# Patient Record
Sex: Male | Born: 1975 | Race: White | Hispanic: No | Marital: Married | Smoking: Current every day smoker
Health system: Southern US, Community
[De-identification: ages and names within clinical notes are randomized; demographics above are authoritative.]

## PROBLEM LIST (undated history)

## (undated) DIAGNOSIS — N200 Calculus of kidney: Secondary | ICD-10-CM

## (undated) DIAGNOSIS — K219 Gastro-esophageal reflux disease without esophagitis: Secondary | ICD-10-CM

## (undated) DIAGNOSIS — K3184 Gastroparesis: Secondary | ICD-10-CM

## (undated) DIAGNOSIS — F4310 Post-traumatic stress disorder, unspecified: Secondary | ICD-10-CM

## (undated) DIAGNOSIS — F32A Depression, unspecified: Secondary | ICD-10-CM

## (undated) DIAGNOSIS — F329 Major depressive disorder, single episode, unspecified: Secondary | ICD-10-CM

## (undated) DIAGNOSIS — F419 Anxiety disorder, unspecified: Secondary | ICD-10-CM

## (undated) HISTORY — PX: APPENDECTOMY: SHX54

## (undated) HISTORY — DX: Depression, unspecified: F32.A

## (undated) HISTORY — PX: ANKLE SURGERY: SHX546

## (undated) HISTORY — DX: Gastro-esophageal reflux disease without esophagitis: K21.9

## (undated) HISTORY — PX: KIDNEY STONE SURGERY: SHX686

## (undated) HISTORY — PX: VASECTOMY: SHX75

## (undated) HISTORY — DX: Major depressive disorder, single episode, unspecified: F32.9

## (undated) HISTORY — PX: FRACTURE SURGERY: SHX138

## (undated) HISTORY — DX: Anxiety disorder, unspecified: F41.9

---

## 2012-09-29 ENCOUNTER — Encounter (HOSPITAL_COMMUNITY): Payer: Self-pay | Admitting: Emergency Medicine

## 2012-09-29 ENCOUNTER — Emergency Department (HOSPITAL_COMMUNITY)
Admission: EM | Admit: 2012-09-29 | Discharge: 2012-10-03 | Disposition: A | Discharge status: Home / Self Care | Attending: Emergency Medicine | Admitting: Emergency Medicine

## 2012-09-29 DIAGNOSIS — F17200 Nicotine dependence, unspecified, uncomplicated: Secondary | ICD-10-CM | POA: Insufficient documentation

## 2012-09-29 DIAGNOSIS — R45851 Suicidal ideations: Secondary | ICD-10-CM | POA: Insufficient documentation

## 2012-09-29 DIAGNOSIS — Z79899 Other long term (current) drug therapy: Secondary | ICD-10-CM | POA: Insufficient documentation

## 2012-09-29 DIAGNOSIS — F4310 Post-traumatic stress disorder, unspecified: Secondary | ICD-10-CM

## 2012-09-29 DIAGNOSIS — F1010 Alcohol abuse, uncomplicated: Secondary | ICD-10-CM | POA: Insufficient documentation

## 2012-09-29 DIAGNOSIS — Z87442 Personal history of urinary calculi: Secondary | ICD-10-CM | POA: Insufficient documentation

## 2012-09-29 DIAGNOSIS — Z8719 Personal history of other diseases of the digestive system: Secondary | ICD-10-CM | POA: Insufficient documentation

## 2012-09-29 DIAGNOSIS — R197 Diarrhea, unspecified: Secondary | ICD-10-CM

## 2012-09-29 DIAGNOSIS — F10929 Alcohol use, unspecified with intoxication, unspecified: Secondary | ICD-10-CM

## 2012-09-29 HISTORY — DX: Calculus of kidney: N20.0

## 2012-09-29 HISTORY — DX: Gastroparesis: K31.84

## 2012-09-29 HISTORY — DX: Post-traumatic stress disorder, unspecified: F43.10

## 2012-09-29 LAB — BASIC METABOLIC PANEL
CO2: 23 mEq/L (ref 19–32)
GFR calc non Af Amer: 82 mL/min — ABNORMAL LOW (ref >90)
Glucose, Bld: 122 mg/dL — ABNORMAL HIGH (ref 70–99)
Potassium: 3.7 mEq/L (ref 3.5–5.1)
Sodium: 138 mEq/L (ref 135–145)

## 2012-09-29 LAB — RAPID URINE DRUG SCREEN, HOSP PERFORMED
Barbiturates: NOT DETECTED
Benzodiazepines: NOT DETECTED
Cocaine: NOT DETECTED
Opiates: NOT DETECTED
Tetrahydrocannabinol: NOT DETECTED

## 2012-09-29 LAB — CBC
Hemoglobin: 16.8 g/dL (ref 13.0–17.0)
Platelets: 322 10*3/uL (ref 150–400)
RBC: 5.17 MIL/uL (ref 4.22–5.81)

## 2012-09-29 MED ORDER — HYDROXYZINE HCL 25 MG PO TABS
25.0000 mg | ORAL_TABLET | Freq: Every day | ORAL | Status: DC
  Administered 2012-09-29 – 2012-10-02 (×4): 25 mg via ORAL
  Filled 2012-09-29 (×3): qty 1

## 2012-09-29 MED ORDER — ALUM & MAG HYDROXIDE-SIMETH 200-200-20 MG/5ML PO SUSP: 30.0000 mL | ORAL | Status: DC | PRN

## 2012-09-29 MED ORDER — NICOTINE 21 MG/24HR TD PT24
21.0000 mg | MEDICATED_PATCH | Freq: Every day | TRANSDERMAL | Status: DC
  Administered 2012-09-29 – 2012-09-30 (×2): 21 mg via TRANSDERMAL
  Filled 2012-09-29 (×2): qty 1

## 2012-09-29 MED ORDER — ZOLPIDEM TARTRATE 5 MG PO TABS
5.0000 mg | ORAL_TABLET | Freq: Every evening | ORAL | Status: DC | PRN
  Administered 2012-09-29 – 2012-09-30 (×2): 5 mg via ORAL
  Filled 2012-09-29 (×2): qty 1

## 2012-09-29 MED ORDER — RISPERIDONE 1 MG PO TABS
1.0000 mg | ORAL_TABLET | Freq: Every day | ORAL | Status: DC
  Filled 2012-09-29 (×7): qty 1

## 2012-09-29 MED ORDER — ACETAMINOPHEN 325 MG PO TABS
650.0000 mg | ORAL_TABLET | ORAL | Status: DC | PRN
  Administered 2012-10-01: 325 mg via ORAL
  Administered 2012-10-01 – 2012-10-03 (×3): 650 mg via ORAL
  Filled 2012-09-29 (×3): qty 2

## 2012-09-29 MED ORDER — IBUPROFEN 400 MG PO TABS
600.0000 mg | ORAL_TABLET | Freq: Three times a day (TID) | ORAL | Status: DC | PRN
  Administered 2012-10-01 – 2012-10-03 (×3): 600 mg via ORAL
  Filled 2012-09-29 (×2): qty 2

## 2012-09-29 MED ORDER — LORAZEPAM 1 MG PO TABS
1.0000 mg | ORAL_TABLET | Freq: Three times a day (TID) | ORAL | Status: DC | PRN
  Administered 2012-09-29 – 2012-09-30 (×3): 1 mg via ORAL
  Filled 2012-09-29 (×3): qty 1

## 2012-09-29 MED ORDER — NAPROXEN 250 MG PO TABS
250.0000 mg | ORAL_TABLET | Freq: Once | ORAL | Status: AC
  Administered 2012-09-29: 250 mg via ORAL
  Filled 2012-09-29: qty 1

## 2012-09-29 MED ORDER — FLUOXETINE HCL 20 MG PO CAPS
40.0000 mg | ORAL_CAPSULE | Freq: Every day | ORAL | Status: DC
  Administered 2012-09-29 – 2012-10-03 (×5): 40 mg via ORAL
  Filled 2012-09-29 (×7): qty 2

## 2012-09-29 MED ORDER — ONDANSETRON HCL 4 MG PO TABS
4.0000 mg | ORAL_TABLET | Freq: Three times a day (TID) | ORAL | Status: DC | PRN
  Administered 2012-09-30: 4 mg via ORAL
  Filled 2012-09-29: qty 1

## 2012-09-29 MED ORDER — DIVALPROEX SODIUM ER 500 MG PO TB24
1000.0000 mg | ORAL_TABLET | Freq: Every day | ORAL | Status: DC
  Administered 2012-09-29 – 2012-10-02 (×4): 1000 mg via ORAL
  Filled 2012-09-29 (×6): qty 2

## 2012-09-29 MED ORDER — SODIUM CHLORIDE 0.9 % IV SOLN
Freq: Once | INTRAVENOUS | Status: AC
  Administered 2012-09-29: 1000 mL via INTRAVENOUS

## 2012-09-29 MED ORDER — ZIPRASIDONE MESYLATE 20 MG IM SOLR
INTRAMUSCULAR | Status: AC
  Administered 2012-09-29: 20 mg via INTRAMUSCULAR
  Filled 2012-09-29: qty 20

## 2012-09-29 NOTE — ED Notes (Signed)
Pt reports "hitting something with right hand last night." Upon assessment, some bruising noted. Pt calm at present time. Pt reports being anxious due to shift change and noise.

## 2012-09-29 NOTE — ED Notes (Signed)
Pt states he does not want to live. Pt has been drinking tonight and states they have been adjusting his psych meds.

## 2012-09-29 NOTE — ED Notes (Signed)
[employer] sitting with patient at this time. Patient is shackled and no IVC paperwork on patient at this time. Dr. Estell Harpin and charge nurse notified. Dr. Estell Harpin stated waiting for recommendations from telepsych consult to see if patient will become IVC. Patient resting at this time with girlfriend at bedside.

## 2012-09-29 NOTE — Consult Note (Signed)
Reason for Consult: Increasing depression and alcohol ingestion with suicidal thoughts Referring Physician: Sunnie Nielsen, M.D.  Curtis [name] is an 37 y.o. male.  HPI: Patient presented to the emergency department accompanied by law enforcement who transported him after his girlfriend called 911 stating that the patient was expressing suicidal thoughts. The patient is a veteran and has been diagnosed with PTSD, and receives services through the [employer]. He reports that he began experiencing increased depression and suicidal thoughts over the past 3-4 weeks when his provider began to taper him off of Effexor and started him on Prozac. He has been self-medicating with alcohol during this time of medication transition. He reports that he normally drinks approximately 12 beers per week, but his drinking has increased significantly as stated above. He denies any other substance abuse. The patient is denying current suicidal thoughts, but has a history of 2 prior attempts, and endorses a detailed plan to cut his wrist to his elbow and stand in the shower while he bleeds out.  Past Medical History  Diagnosis Date  . PTSD (post-traumatic stress disorder)   . Gastroparesis   . Kidney stone     Past Surgical History  Procedure Laterality Date  . Ankle surgery      History reviewed. No pertinent family history.  Social History:  reports that he has been smoking.  He does not have any smokeless tobacco history on file. He reports that  drinks alcohol. He reports that he does not use illicit drugs.  Allergies: No Known Allergies  Medications:  I have reviewed the patient's current medications. In addition to Prozac, he states that he is prescribed Risperdal, Atarax, and Depakote. He was unable to provide doses.   Results for orders placed during the hospital encounter of 09/29/12 (from the past 48 hour(s))  CBC     Status: Abnormal   Collection Time    09/29/12  4:34 AM   Result Value Range   WBC 21.3 (*) 4.0 - 10.5 K/uL   RBC 5.17  4.22 - 5.81 MIL/uL   Hemoglobin 16.8  13.0 - 17.0 g/dL   HCT 65.7  84.6 - 96.2 %   MCV 89.2  78.0 - 100.0 fL   MCH 32.5  26.0 - 34.0 pg   MCHC 36.4 (*) 30.0 - 36.0 g/dL   RDW 95.2  84.1 - 32.4 %   Platelets 322  150 - 400 K/uL  BASIC METABOLIC PANEL     Status: Abnormal   Collection Time    09/29/12  4:34 AM      Result Value Range   Sodium 138  135 - 145 mEq/L   Potassium 3.7  3.5 - 5.1 mEq/L   Chloride 99  96 - 112 mEq/L   CO2 23  19 - 32 mEq/L   Glucose, Bld 122 (*) 70 - 99 mg/dL   BUN 10  6 - 23 mg/dL   Creatinine, Ser 4.01  0.50 - 1.35 mg/dL   Calcium 9.7  8.4 - 02.7 mg/dL   GFR calc non Af Amer 82 (*) >90 mL/min   GFR calc Af Amer >90  >90 mL/min   Comment: (NOTE)     The eGFR has been calculated using the CKD EPI equation.     This calculation has not been validated in all clinical situations.     eGFR's persistently <90 mL/min signify possible Chronic Kidney     Disease.  ETHANOL     Status: Abnormal  Collection Time    09/29/12  4:34 AM      Result Value Range   Alcohol, Ethyl (B) 199 (*) 0 - 11 mg/dL   Comment:            LOWEST DETECTABLE LIMIT FOR     SERUM ALCOHOL IS 11 mg/dL     FOR MEDICAL PURPOSES ONLY  URINE RAPID DRUG SCREEN (HOSP PERFORMED)     Status: None   Collection Time    09/29/12  4:42 AM      Result Value Range   Opiates NONE DETECTED  NONE DETECTED   Cocaine NONE DETECTED  NONE DETECTED   Benzodiazepines NONE DETECTED  NONE DETECTED   Amphetamines NONE DETECTED  NONE DETECTED   Tetrahydrocannabinol NONE DETECTED  NONE DETECTED   Barbiturates NONE DETECTED  NONE DETECTED   Comment:            DRUG SCREEN FOR MEDICAL PURPOSES     ONLY.  IF CONFIRMATION IS NEEDED     FOR ANY PURPOSE, NOTIFY LAB     WITHIN 5 DAYS.                LOWEST DETECTABLE LIMITS     FOR URINE DRUG SCREEN     Drug Class       Cutoff (ng/mL)     Amphetamine      1000     Barbiturate      200      Benzodiazepine   200     Tricyclics       300     Opiates          300     Cocaine          300     THC              50    No results found.  ROS Blood pressure 122/76, pulse 91, temperature 97.2 F (36.2 C), temperature source Oral, resp. rate 16, height 6\' 2"  (1.88 m), weight 96.616 kg (213 lb), SpO2 95.00%. Physical Exam Physical exam not performed as consult was done from a remote location. Please refer to the history and physical exam as performed by the referring doctor in the emergency department.  Assessment/Plan: Inpatient hospitalization is recommended for purposes of safety and stabilization, and for closer observation during the period of transition of antidepressant medications. The patient is requesting to go to the [facility] in [place], [place]. We will check on availability of beds and make the appropriate referral.  Curtis [name] 09/29/2012, 10:28 AM

## 2012-09-29 NOTE — ED Notes (Signed)
[facility] deputy left after being informed that Dr. Estell Harpin was not planning on taking out IVC paperwork, but patient was still to be admitted inpatient at a psychiatric facility. Deputy reported he did not have to be here, removed shackles from patient, and left ED. Charge nurse notified and ED tech sitting with patient at this time.

## 2012-09-29 NOTE — BHH Counselor (Signed)
PA recommends Inptx admission.  [facility] [place] and spoke to AOD [place].  She will fax form to [facility] for [place] to complete and send back.    [facility] and won't be able to [facility] pt until Tuesday based on current info per AOD.

## 2012-09-29 NOTE — ED Notes (Addendum)
Psychiatrist consult completed at bedside via monitor. Psychiatrist recommended in patient admission at [facility]. Psychiatrist reported would check with case manager regarding bed availability. EDP aware.

## 2012-09-29 NOTE — ED Notes (Signed)
Live in girlfriend called and request pt be transferred to [facility]. States that would be closer for her. States he has ptss really bad.

## 2012-09-29 NOTE — BHH Counselor (Signed)
AP reports GF of pt recommends pt go to [facility] in [place] instead of [place] if pt needs Inptx.  Jorje Guild PA will conduct psychiatry consult at 1015 today.  AP aware of consult time and will have machine set and pt prep

## 2012-09-29 NOTE — ED Notes (Signed)
After crying jag, now staring at fixed point, clenched shaking fists, non responsive to my attempts to get his attention. Dr Theodoro Kalata aware and pt medicated with geodon.

## 2012-09-29 NOTE — ED Notes (Signed)
Patient requested lunch tray to be reheated. Informed patient dinner tray was coming as well. Patient stated may eat both. Lunch tray reheated and given to patient.

## 2012-09-29 NOTE — ED Provider Notes (Signed)
Tele-psyc  Suggests in pt tx for suicidal,  But pt not committed  Benny Lennert, MD 09/29/12 1414

## 2012-09-29 NOTE — ED Notes (Addendum)
[employer] at bedside. Night shift, Dr. Dierdre Highman, reported pt not IVC at this time and would tell day shift EDP to re-evaluate pt concerning IVC or voluntary status once pt wakes up and is more alert and interactive.[employer] aware. Upon entering room, pt alseep. Chest rise and fall symmetrical. Pt arousable. Airway patent. nad noted. Telepsych to be completed at 8 am.

## 2012-09-29 NOTE — ED Provider Notes (Signed)
CSN: [id number]     Arrival date & time 09/29/12  0356 History   First MD Initiated Contact with Patient 09/29/12 0413     Chief Complaint  Patient presents with  . V70.1   (Consider location/radiation/quality/duration/timing/severity/associated sxs/prior Treatment) HPI History provided by patient and Sheriff. Suicidal ideation with history of PTSD. Patient admits to alcohol abuse tonight. His girlfriend called 911 after patient expressing suicidal ideation. He does not have a plan. States he does not want to live. He is very frustrated in the emergency room because he was requesting to be transported to [facility].  No self injury. Patient denies any ingestions or drug use otherwise. Symptoms moderate in severity. Patient and extremely tachycardic on arrival. He admits to being very agitated and is requesting something to help him "calm down". He is concerned that his Prozac is not working. Geodon given.  Past Medical History  Diagnosis Date  . PTSD (post-traumatic stress disorder)   . Gastroparesis   . Kidney stone    Past Surgical History  Procedure Laterality Date  . Ankle surgery     History reviewed. No pertinent family history. History  Substance Use Topics  . Smoking status: Current Every Day Smoker  . Smokeless tobacco: Not on file  . Alcohol Use: Yes    Review of Systems  Constitutional: Negative for fever.  HENT: Negative for neck pain.   Eyes: Negative for visual disturbance.  Respiratory: Negative for shortness of breath.   Cardiovascular: Negative for chest pain.  Gastrointestinal: Negative for abdominal pain.  Musculoskeletal: Negative for back pain.  Skin: Negative for rash.  Neurological: Negative for weakness and numbness.  Psychiatric/Behavioral: Positive for agitation.  All other systems reviewed and are negative.    Allergies  Review of patient's allergies indicates no known allergies.  Home Medications   Current Outpatient Rx  Name  Route  Sig   Dispense  Refill  . FLUoxetine (PROZAC) 40 MG capsule   Oral   Take 40 mg by mouth daily.         Marland Kitchen oxyCODONE-acetaminophen (PERCOCET) 10-325 MG per tablet   Oral   Take 1 tablet by mouth every 4 (four) hours as needed for pain.          BP 132/97  Pulse 168  Temp(Src) 99.7 F (37.6 C) (Oral)  Resp 22  Ht 6\' 2"  (1.88 m)  Wt 213 lb (96.616 kg)  BMI 27.34 kg/m2  SpO2 98% Physical Exam  Constitutional: He is oriented to person, place, and time. He appears well-developed and well-nourished.  HENT:  Head: Normocephalic and atraumatic.  Eyes: EOM are normal. Pupils are equal, round, and reactive to light.  Neck: Neck supple.  Cardiovascular: Intact distal pulses.   Tachycardic  Pulmonary/Chest: Effort normal and breath sounds normal. No respiratory distress.  Musculoskeletal: Normal range of motion. He exhibits no edema.  Neurological: He is alert and oriented to person, place, and time.  Skin: Skin is warm and dry.  Psychiatric:  Agitated, pacing in exam room, intermittent shouting and loud outbursts    ED Course  Procedures (including critical care time)  CRITICAL CARE Performed by: Sunnie Nielsen Total critical care time: 30 Critical care time was exclusive of separately billable procedures and treating other patients. Critical care was necessary to treat or prevent imminent or life-threatening deterioration. Critical care was time spent personally by me on the following activities: development of treatment plan with patient and/or surrogate as well as nursing, discussions with consultants, evaluation of patient's  response to treatment, examination of patient, obtaining history from patient or surrogate, ordering and performing treatments and interventions, ordering and review of laboratory studies, ordering and review of radiographic studies, pulse oximetry and re-evaluation of patient's condition.    Date: 09/29/2012  Rate: 128  Rhythm: sinus tachycardia  QRS Axis:  normal  Intervals: normal  ST/T Wave abnormalities: nonspecific ST changes  Conduction Disutrbances:none  Narrative Interpretation:   Old EKG Reviewed: none available  Results for orders placed during the hospital encounter of 09/29/12  CBC      Result Value Range   WBC 21.3 (*) 4.0 - 10.5 K/uL   RBC 5.17  4.22 - 5.81 MIL/uL   Hemoglobin 16.8  13.0 - 17.0 g/dL   HCT 16.1  09.6 - 04.5 %   MCV 89.2  78.0 - 100.0 fL   MCH 32.5  26.0 - 34.0 pg   MCHC 36.4 (*) 30.0 - 36.0 g/dL   RDW 40.9  81.1 - 91.4 %   Platelets 322  150 - 400 K/uL  BASIC METABOLIC PANEL      Result Value Range   Sodium 138  135 - 145 mEq/L   Potassium 3.7  3.5 - 5.1 mEq/L   Chloride 99  96 - 112 mEq/L   CO2 23  19 - 32 mEq/L   Glucose, Bld 122 (*) 70 - 99 mg/dL   BUN 10  6 - 23 mg/dL   Creatinine, Ser 7.82  0.50 - 1.35 mg/dL   Calcium 9.7  8.4 - 95.6 mg/dL   GFR calc non Af Amer 82 (*) >90 mL/min   GFR calc Af Amer >90  >90 mL/min  URINE RAPID DRUG SCREEN (HOSP PERFORMED)      Result Value Range   Opiates NONE DETECTED  NONE DETECTED   Cocaine NONE DETECTED  NONE DETECTED   Benzodiazepines NONE DETECTED  NONE DETECTED   Amphetamines NONE DETECTED  NONE DETECTED   Tetrahydrocannabinol NONE DETECTED  NONE DETECTED   Barbiturates NONE DETECTED  NONE DETECTED  ETHANOL      Result Value Range   Alcohol, Ethyl (B) 199 (*) 0 - 11 mg/dL   2:13 AM resting comfortably, tachycardia improved  Plan telemetry psych consult when able and psychiatric disposition. Plan sober and reassess.  MDM  Diagnosis: Depression, alcohol intoxication, history of PTSD        Sunnie Nielsen, MD 09/29/12 [id number]

## 2012-09-29 NOTE — ED Notes (Signed)
Patient refused lunch tray.

## 2012-09-29 NOTE — ED Notes (Addendum)
Spoke with Dr. Estell Harpin regarding plan for patient. Dr. Estell Harpin reviewed recommendations after patient's second telepsych consult. No recommendations for IVC and plan right now is to not make patient IVC, but for patient to have inpatient psychiatric admission. Patient and girlfriend notified of plan.

## 2012-09-29 NOTE — ED Notes (Signed)
Ford with [facility] given time of 8am.

## 2012-09-29 NOTE — ED Notes (Addendum)
Pt still drowsy but easily arousable and educated on telepsych process. Telepsych computer at bedside awaiting consult. nad noted.RPD at bedside pt remains shackled on right ankle to bed.

## 2012-09-29 NOTE — ED Notes (Addendum)
At bedside during Telepsych. Telepsych reccommended inpatient placement with med management.Telepsych recommended a psychiatrist follow-up in a few hours via monitor as well.  Pt verbalized understanding and acceptance of plan. [employer]PD at bedside. Pt awake, alert and interactive. EDP aware of telepsych recommendations.

## 2012-09-29 NOTE — BH Assessment (Signed)
Assessment Note  Curtis [name] is an 37 y.o. male.   Pt is tearful and cooperative who answered politely to all questions.  Pt is a veteran who is suffering from PTSD and depression.  Pt is being treated by [place] in [place].  Pt MD switched his medication from Effexor to Prozac 2 weeks ago.  Pt reports "I haven't been feeling right.  I have stayed in bed, not getting dressed.  I started drinking more to try to self medicate, I [name].  I don't usually drink more than two times per month."    Pt reports he has attempted suicide twice with the most recent attempt in Sept of 2013.  He ran his car in a closed garage and was discovered.  He also has cut wrist in previous attempts.  He has thought about OD as well.  Pt denies current SI, HI and AVH.  Pt denies any ongoing issues with alcohol.  This reviewer perceives pt to be minimizing, but pt input is important.  Pt reports consuming alcohol 2x per month.    Pt lives with his girlfriend whom he considers supportive.  The GF is the one who contacted authorities and had pt brought to ED.  She was not in the room with pt but will be back later per pt report.  The nurse in the room present during the assessment and TTS explained to here recommendation.  Nurse acknowledged she understood rationale.  Sheriff was also in the room with pt.    Pt was Ox3, judgment impaired, highly anxious, tearful, polite, disheveled in appearance, interacted well with reviewer, knowledgeable about care and meds, positive comments about his experience about the [facility] and timely treatment, reports staying in bed, speech clear, poor eye contact.  Recommendation:  Pt has two prior attempts to commit suicide.  These attempts involved carbon monixide poisoning and cutting wrist.  Both resulted in hospitalization.  Pt is currently having from his perspective "an reaction to the Prozac."  Pt needs a psychiatry consult to determine if medication adjustments can be made in the ED to avoid  Inptx or if Inptx is needed in addition to monitoring medication related issues.    Nurse is aware that a psychiatric consult will occur at 1015.    Pt informed that TTS recommends Inptx tx and that psychiatry needs to evaluate to determine if that is the plan or another direction can be considered.  Pt was tearful but verbalized his understanding of current plan.  [facility] has to be the route.    Axis I: Generalized Anxiety Disorder, Major Depression, Recurrent severe and Post Traumatic Stress Disorder Axis II: Deferred Axis III:  Past Medical History  Diagnosis Date  . PTSD (post-traumatic stress disorder)   . Gastroparesis   . Kidney stone    Axis IV: other psychosocial or environmental problems and problems related to social environment Axis V: 41-50 serious symptoms  Past Medical History:  Past Medical History  Diagnosis Date  . PTSD (post-traumatic stress disorder)   . Gastroparesis   . Kidney stone     Past Surgical History  Procedure Laterality Date  . Ankle surgery      Family History: History reviewed. No pertinent family history.  Social History:  reports that he has been smoking.  He does not have any smokeless tobacco history on file. He reports that  drinks alcohol. He reports that he does not use illicit drugs.  Additional Social History:  Alcohol / Drug Use Pain Medications: na Prescriptions: na Over the Counter: na History of alcohol / drug use?: No history of alcohol / drug abuse Longest period of sobriety (when/how long): na - consumes alcohol 2x per mth  CIWA: CIWA-Ar BP: 122/76 mmHg Pulse Rate: 91 COWS:    Allergies: No Known Allergies  Home Medications:  (Not in a hospital admission)  OB/GYN Status:  No LMP for male patient.  General Assessment Data Location of Assessment: [facility] ED Is this a Tele or Face-to-Face Assessment?: Tele Assessment Is this an Initial Assessment or a Re-assessment  for this encounter?: Initial Assessment Living Arrangements: Spouse/significant other Can pt return to current living arrangement?: Yes Admission Status: Voluntary Is patient capable of signing voluntary admission?: Yes Transfer from: Acute Hospital Referral Source: MD  Medical Screening Exam [facility] Walk-in ONLY) Medical Exam completed: Yes  [facility] Crisis Care Plan Living Arrangements: Spouse/significant other  Education Status Is patient currently in school?: No  Risk to self Suicidal Ideation: No-Not Currently/Within Last 6 Months Suicidal Intent: No-Not Currently/Within Last 6 Months Is patient at risk for suicide?: Yes Suicidal Plan?: No-Not Currently/Within Last 6 Months Access to Means: Yes Specify Access to Suicidal Means: 2 prior attemtps; has guns in house What has been your use of drugs/alcohol within the last 12 months?: alcohol 2x per mth; maybe miminized Previous Attempts/Gestures: Yes How many times?: 2 Other Self Harm Risks: na Triggers for Past Attempts: Unpredictable;Other (Comment) (PTSD) Intentional Self Injurious Behavior: None Family Suicide History: No Recent stressful life event(s): Other (Comment);Trauma (Comment) (Vet; PTSD; recent med change) Persecutory voices/beliefs?: No Depression: Yes Depression Symptoms: Tearfulness;Isolating;Fatigue;Guilt;Loss of interest in usual pleasures;Feeling worthless/self pity;Feeling angry/irritable Substance abuse history and/or treatment for substance abuse?: Yes Suicide prevention information given to non-admitted patients: Not applicable  Risk to Others Homicidal Ideation: No Thoughts of Harm to Others: No Current Homicidal Intent: No Current Homicidal Plan: No Access to Homicidal Means: No Identified Victim: na History of harm to others?: No Assessment of Violence: None Noted Violent Behavior Description: cooperative, but tearful Does patient have access to weapons?: Yes (Comment) Criminal Charges Pending?:  No Does patient have a court date: No  Psychosis Hallucinations: None noted Delusions: None noted  Mental Status Report Appear/Hygiene: Disheveled Eye Contact: Poor Motor Activity: Unremarkable Speech: Logical/coherent Level of Consciousness: Alert Mood: Depressed;Anxious;Ashamed/humiliated;Sad;Worthless, low self-esteem Affect: Anxious;Appropriate to circumstance;Depressed;Sad Anxiety Level: Severe Thought Processes: Coherent Judgement: Impaired Orientation: Person;Place;Situation Obsessive Compulsive Thoughts/Behaviors: None  Cognitive Functioning Concentration: Decreased Memory: Recent Intact;Remote Intact IQ: Average Insight: Fair Impulse Control: Poor Appetite: Fair Weight Loss: 0 Weight Gain: 0 Sleep: Increased Total Hours of Sleep: 20 Vegetative Symptoms: Staying in bed;Decreased grooming  ADLScreening [facility] Assessment Services) Patient's cognitive ability adequate to safely complete daily activities?: Yes Patient able to express need for assistance with ADLs?: Yes Independently performs ADLs?: Yes (appropriate for developmental age)  Prior Inpatient Therapy Prior Inpatient Therapy: Yes Prior Therapy Dates: 10/2011; 2012 [facility]  Reason for Treatment: attempts to end lilfe  Prior Outpatient Therapy Prior Outpatient Therapy: Yes Prior Therapy Dates: current [facility] Reason for Treatment: med mgt; therapy  ADL Screening (condition at time of admission) Patient's cognitive ability adequate to safely complete daily activities?: Yes Is the patient deaf or have difficulty hearing?: No Does the patient have difficulty seeing, even when wearing glasses/contacts?: No Does the patient have difficulty concentrating, remembering, or making decisions?: No Patient able to express need for assistance with ADLs?: Yes  Does the patient have difficulty dressing or bathing?: No Independently performs  ADLs?: Yes (appropriate for developmental age) Does the patient have difficulty walking or climbing stairs?: No Weakness of Legs: None Weakness of Arms/Hands: None  Home Assistive Devices/Equipment Home Assistive Devices/Equipment: None  Therapy Consults (therapy consults require a physician order) PT Evaluation Needed: No OT Evalulation Needed: No SLP Evaluation Needed: No Abuse/Neglect Assessment (Assessment to be complete while patient is alone) Physical Abuse: Denies Verbal Abuse: Denies Sexual Abuse: Denies Exploitation of patient/patient's resources: Denies Self-Neglect: Denies Values / Beliefs Cultural Requests During Hospitalization: None Spiritual Requests During Hospitalization: None Consults Spiritual Care Consult Needed: No Social Work Consult Needed: No Merchant navy officer (For Healthcare) Advance Directive: Patient does not have advance directive Pre-existing out of facility DNR order (yellow form or pink MOST form): No    Additional Information 1:1 In Past 12 Months?: No CIRT Risk: No Elopement Risk: No Does patient have medical clearance?: Yes     Disposition:  Disposition Initial Assessment Completed for this Encounter: Yes Disposition of Patient: Inpatient treatment program;[facility]) Type of inpatient treatment program: Adult Patient referred to: Other (Comment) ([facility] )  On Site Evaluation by:   Reviewed with Physician:    Macon Large 09/29/2012 8:23 AM

## 2012-09-30 LAB — CBC
HCT: 43.4 % (ref 39.0–52.0)
MCHC: 36.2 g/dL — ABNORMAL HIGH (ref 30.0–36.0)
RDW: 12.3 % (ref 11.5–15.5)

## 2012-09-30 MED ORDER — NAPROXEN 250 MG PO TABS
500.0000 mg | ORAL_TABLET | Freq: Once | ORAL | Status: AC
  Administered 2012-09-30: 500 mg via ORAL
  Filled 2012-09-30: qty 2

## 2012-09-30 MED ORDER — LORAZEPAM 1 MG PO TABS
1.0000 mg | ORAL_TABLET | Freq: Once | ORAL | Status: AC
  Administered 2012-09-30: 1 mg via ORAL
  Filled 2012-09-30: qty 1

## 2012-09-30 NOTE — ED Notes (Signed)
Family at bedside. Patients's wife states he needs something for his nerves this morning. Patient seems to be slighted agitated when taking vitals. Patient slept through the night.

## 2012-09-30 NOTE — ED Provider Notes (Signed)
Curtis [name], Curtis [name] called, states he talked to the PA on call who told him an evaluation would need to be done by a psychiatrist, however the psychiatrist on call can't do telepsych from home or any of the other psychiatrists he contacted tonight. Curtis [name] states they are supposed to be able to do the telepsych from their laptops. States it will have to wait until the morning when a psychiatrist comes to the hospital.   Devoria Albe, MD, Franz Dell, MD 09/30/12 2052

## 2012-09-30 NOTE — BH Assessment (Signed)
[facility] and will not be able to accept admission until possibly Tuesday. Transfer paperwork has been completed but needs to be signed by an MD. Tressie Ellis [facility] adult unit is currently at capacity per Laverle Hobby, [facility]. Contacted the following facilities seeking placement:   [facility]: unit at capacity  [facility]: unit at capacity  [facility]: unit at capacity  [facility]: unit at capacity [facility]: unit at capacity  [facility]: unit at [facility]: unit at capacity  [facility]: unit at capacity  [facility]: unit at capacity  [facility]: unit at capacity  [facility] Patsy [place], [facility], [facility] Triage Specialist

## 2012-09-30 NOTE — ED Notes (Signed)
Refused risperdal - states he was previously on this medication and it caused "bad side effects"; pt reports severe joint pain and swollen joints when on risperdal previously.  Requesting to be put on Abilify instead; states this is what his MD was going to try next for him, but he has never taken it before.

## 2012-09-30 NOTE — ED Notes (Signed)
Pt half brother Aneta Mins) in room to visit. Visitor was wanded  By security

## 2012-09-30 NOTE — Progress Notes (Signed)
Patient ID: Curtis [name], male   DOB: [birth year], 37 y.o.   MRN: [id number] Recived request from [facility] for MD telepsych on this pt to D/C.Informed [facility] that Suicide Risk Assessment must be done by MD and to contact MD on call

## 2012-09-30 NOTE — ED Notes (Signed)
Pt up to bathroom at this time. Sitter with pt. Girlfriend at bsd. nad noted.

## 2012-09-30 NOTE — ED Notes (Signed)
[facility] called to [facility] No beds at present moment. Informed [facility] that pt is wanting to leave. Dr. Lynelle Doctor also aware. We will reconsult TelePsych for another evaluation.

## 2012-09-30 NOTE — ED Notes (Signed)
Pt anxious at this time and requesting more medication. Dr. Lynelle Doctor aware and  Ok to give Ativan before 8hr mark of next administration.

## 2012-09-30 NOTE — ED Notes (Signed)
Pt c/o feeling anxious/agitated - requesting medication for same.  Dr. Estell Harpin notified and orders rec'd.

## 2012-09-30 NOTE — ED Notes (Signed)
Spoke with [facility] staff - I informed them that pt would prefer to go to [place] in [place], [place] (per pt, this facility specializes in PTSD).  Per [facility] staff, [facility] does not accept pt's on weekend, but they can try tomorrow; also that placement will ultimately depend on bed availability.

## 2012-09-30 NOTE — ED Notes (Signed)
Patient resting and watching TV at this time. No distress noted.

## 2012-09-30 NOTE — ED Notes (Signed)
Pt awaiting Tele Psych consult

## 2012-09-30 NOTE — ED Notes (Signed)
Pt becoming anxious with admission process. Pt asking when he will be leaving. Pt does NOT wish to stay at this time. Dr. Lynelle Doctor aware of pt wanting to leave.

## 2012-09-30 NOTE — ED Provider Notes (Signed)
5:26 PM Pt left at change of shift, waiting placement at [facility] for PTSD. Pt states he "idolizes" hurting himself and he has been doing that for a long time and it doesn't mean he is going to do it, he states he just happened to tell someone about it. He states he is feeling better and doesn't want to wait here for a bed at the [facility]. He states he wants to go to the [place] in [place] which specializes in PTSD, not [place].  Girlfriend reports she can take him where he needs to be and can take care of him if he gets upset. She feels comfortable if he leaves that she can manage him. She states he looks better than yesterday morning.    Will get another telepsych since inpatient was recommended.     Ward Givens, MD 10/01/12 [phone]

## 2012-09-30 NOTE — BH Assessment (Signed)
[facility] Assessment Progress Note     Contacted [facility] and spoke with Revonda Standard, RN. Dr. Theotis Barrio stated ED physician can sign VA medical paperwork for the patient. Valda Favia the information to [place], who will have Dr. Estell Harpin sign paperwork; then to fax it to [facility] (instructions given on Fax cover sheet). Reviewed notes; it appears [facility] may not be able to take the patient until Tuesday. Contacted [facility] and spoke with [place], who stated they are not taking referrals at this time, but they have some scheduled discharges tomorrow. He stated to contact [facility] AOD tomorrow to make referral. The number to [facility] is [phone], ext. [phone].   Shon Baton, LCSW, LCASA

## 2012-09-30 NOTE — BH Assessment (Addendum)
Staff at [facility] requested tele-psychiatry to determine if Pt can be discharged or needs hospitalization. Consulted with Maryjean Morn, PA who said suicide risk assessment required to send patients home must be completed by MD. Contacted Dr. Sudie Grumbling, psychiatrist on-call, who said he does not have the ability to perform tele-psych from home and recommended I contact Dr. Sheela Stack, who is not on-call. Attempted to contact Dr. Lolly Mustache without success. Contacted Dr. Mervyn Gay, psychiatrist on-call for child/adol and he does not have the ability to perform tele-psych from home. Laverle Hobby, [facility] at [facility] said she contacted [facility] administration on-call, Royal Hawthorn, who said the physicians may have access to do tele-psych from laptops but if not the patient would need to be evaluated tomorrow. Notified Dr. Devoria Albe at [facility] of this decision.  Harlin Rain Ria Comment, [facility] Triage Specialist

## 2012-10-01 MED ORDER — IBUPROFEN 800 MG PO TABS
ORAL_TABLET | ORAL | Status: AC
  Administered 2012-10-01: 600 mg via ORAL
  Filled 2012-10-01: qty 1

## 2012-10-01 MED ORDER — LOPERAMIDE HCL 2 MG PO CAPS
2.0000 mg | ORAL_CAPSULE | Freq: Once | ORAL | Status: AC
  Administered 2012-10-01: 2 mg via ORAL

## 2012-10-01 MED ORDER — LOPERAMIDE HCL 2 MG PO CAPS
ORAL_CAPSULE | ORAL | Status: AC
  Administered 2012-10-01: 2 mg via ORAL
  Filled 2012-10-01: qty 1

## 2012-10-01 MED ORDER — LORAZEPAM 1 MG PO TABS
2.0000 mg | ORAL_TABLET | Freq: Three times a day (TID) | ORAL | Status: DC | PRN
  Administered 2012-10-01 – 2012-10-03 (×4): 2 mg via ORAL
  Filled 2012-10-01 (×3): qty 2

## 2012-10-01 MED ORDER — IBUPROFEN 400 MG PO TABS
ORAL_TABLET | ORAL | Status: AC
  Filled 2012-10-01: qty 2

## 2012-10-01 MED ORDER — HYDROXYZINE HCL 25 MG PO TABS
ORAL_TABLET | ORAL | Status: AC
  Administered 2012-10-01: 25 mg via ORAL
  Filled 2012-10-01: qty 1

## 2012-10-01 MED ORDER — NICOTINE 21 MG/24HR TD PT24
MEDICATED_PATCH | TRANSDERMAL | Status: AC
  Administered 2012-10-01: 21 mg
  Filled 2012-10-01: qty 1

## 2012-10-01 MED ORDER — NICOTINE 21 MG/24HR TD PT24
MEDICATED_PATCH | TRANSDERMAL | Status: AC
  Filled 2012-10-01: qty 1

## 2012-10-01 MED ORDER — ZIPRASIDONE MESYLATE 20 MG IM SOLR
10.0000 mg | Freq: Four times a day (QID) | INTRAMUSCULAR | Status: DC | PRN
  Administered 2012-10-01 – 2012-10-02 (×4): 10 mg via INTRAMUSCULAR
  Filled 2012-10-01 (×4): qty 20

## 2012-10-01 MED ORDER — LORAZEPAM 1 MG PO TABS
ORAL_TABLET | ORAL | Status: AC
  Administered 2012-10-01: 2 mg via ORAL
  Filled 2012-10-01: qty 2

## 2012-10-01 MED ORDER — LOPERAMIDE HCL 2 MG PO CAPS: 2.0000 mg | ORAL_CAPSULE | ORAL | Status: DC | PRN

## 2012-10-01 MED ORDER — KETOROLAC TROMETHAMINE 60 MG/2ML IM SOLN
60.0000 mg | Freq: Once | INTRAMUSCULAR | Status: AC
  Administered 2012-10-01: 60 mg via INTRAMUSCULAR
  Filled 2012-10-01: qty 2

## 2012-10-01 MED ORDER — ACETAMINOPHEN 325 MG PO TABS
ORAL_TABLET | ORAL | Status: AC
  Administered 2012-10-01: 325 mg via ORAL
  Filled 2012-10-01: qty 2

## 2012-10-01 MED ORDER — LORAZEPAM 1 MG PO TABS
ORAL_TABLET | ORAL | Status: AC
  Filled 2012-10-01: qty 1

## 2012-10-01 NOTE — ED Notes (Signed)
Resting with eyes closed .  No distress

## 2012-10-01 NOTE — ED Notes (Signed)
Patient c/o diarrhea. Requesting Immodium. MD aware and verbal order obtained.

## 2012-10-01 NOTE — ED Notes (Signed)
Pt states that his joints are aching and "my skin hurts".

## 2012-10-01 NOTE — ED Notes (Signed)
Patient c/o generalized body aches. Anxious and agitated. RN offered Ativan and Ibuprofen/Tylenol as options for patient. Patient reports ativan has not been helping with anxiety. MD notified and Dr Oletta Cohn to bedside to evaluate patient. Significant other at bedside and supportive.

## 2012-10-01 NOTE — ED Provider Notes (Signed)
Patient complaining of agitation this morning. Reports that the Ativan only partly takes care of his anxiety. He reports "ups and downs". We'll increase dose to 2 mg as needed every 8 hours and also a when necessary order for Geodon which the patient received upon his initial evaluation in the ER and seemed to do well with. Continue to pursue placement.  Gilda Crease, MD 10/01/12 [phone]

## 2012-10-01 NOTE — ED Notes (Signed)
Left message at [facility] to set up tele psych time. Curtis [name]  

## 2012-10-01 NOTE — ED Notes (Signed)
Family at bedside. Patient taken to shower by Security Danie Chandler.

## 2012-10-02 MED ORDER — NICOTINE 21 MG/24HR TD PT24
MEDICATED_PATCH | TRANSDERMAL | Status: AC
  Filled 2012-10-02: qty 1

## 2012-10-02 NOTE — ED Notes (Signed)
Walked patient to restroom.

## 2012-10-02 NOTE — Consult Note (Signed)
  A repeat tele -psych consult was requested yesterday evening by the EDP, presumably because the patient was requesting to be D/C and not wanting to follow up with prior plan to transfer to [facility] for mgmt of mood d/o with SI and PTSD. The patient who is now no longer intoxicated has reaffirmed his wishes to transfer to a [facility] for mgmt of mood d/o and PTSD and no longer wants to sign out or be d/c from [facility] ED prior to social works transfer of the patient to [place] facility.

## 2012-10-02 NOTE — ED Notes (Signed)
Girlfriend at bedside, pt alert, able to answer questions, denies any needs at present time,

## 2012-10-02 NOTE — Consult Note (Signed)
Agree with above. The patient would benefit from further evaluation and treatment.

## 2012-10-02 NOTE — ED Notes (Signed)
Pt showered in decontamination room by EMS bay.

## 2012-10-02 NOTE — ED Notes (Signed)
Received report on pt, pt resting with eyes closed, will arouse when spoken to, sitter remains at bedside,

## 2012-10-02 NOTE — Progress Notes (Signed)
B.Oaklee Esther, MHT provided follow up with referral previously made to [facility] in [place] for patient who is pending psychiatric placement while at [facility]. Writer spoke with Caryn Bee, AOD) who confirmed that referral was received and is under review. Caryn Bee reports that he will provide up date to status of referral

## 2012-10-02 NOTE — ED Provider Notes (Signed)
Pt resting comfortably No distress Awaiting placement at this time BP 122/71  Pulse 92  Temp(Src) 98.7 F (37.1 C) (Oral)  Resp 20  Ht 6\' 2"  (1.88 m)  Wt 213 lb (96.616 kg)  BMI 27.34 kg/m2  SpO2 97%   Joya Gaskins, MD 10/02/12 2342

## 2012-10-02 NOTE — ED Notes (Signed)
Pt given lunch tray, girlfriend at bedside,

## 2012-10-02 NOTE — ED Notes (Signed)
Spoke with [facility] and was advised that pt is still pending placement,

## 2012-10-02 NOTE — ED Notes (Addendum)
Spoke with [facility] assessment team who advised that pt is still pending placement at [facility] hospital and that they would [facility] with [facility] this am and follow up with Jeani Hawking and pt. Pt updated on plan of care,

## 2012-10-02 NOTE — ED Notes (Signed)
Pt states that he is still having anxiety, requesting geodon shot,

## 2012-10-03 MED ORDER — ONDANSETRON 8 MG PO TBDP
8.0000 mg | ORAL_TABLET | Freq: Three times a day (TID) | ORAL | Status: DC | PRN
  Administered 2012-10-03: 8 mg via ORAL
  Filled 2012-10-03: qty 1

## 2012-10-03 MED ORDER — ONDANSETRON HCL 4 MG PO TABS: 4.0000 mg | ORAL_TABLET | Freq: Four times a day (QID) | ORAL | Status: DC

## 2012-10-03 MED ORDER — FLUOXETINE HCL 40 MG PO CAPS: 40.0000 mg | ORAL_CAPSULE | Freq: Every day | ORAL | Status: DC

## 2012-10-03 MED ORDER — LOPERAMIDE HCL 2 MG PO CAPS
2.0000 mg | ORAL_CAPSULE | Freq: Once | ORAL | Status: AC
Start: 2012-10-03 — End: 2012-10-03
  Administered 2012-10-03: 2 mg via ORAL
  Filled 2012-10-03: qty 1

## 2012-10-03 MED ORDER — HYDROXYZINE HCL 25 MG PO TABS: 25.0000 mg | ORAL_TABLET | Freq: Every day | ORAL | Status: AC

## 2012-10-03 MED ORDER — DIVALPROEX SODIUM ER 500 MG PO TB24: 1000.0000 mg | ORAL_TABLET | Freq: Every day | ORAL | Status: DC

## 2012-10-03 MED ORDER — ACETAMINOPHEN 325 MG PO TABS
650.0000 mg | ORAL_TABLET | Freq: Once | ORAL | Status: AC
  Administered 2012-10-03: 650 mg via ORAL
  Filled 2012-10-03: qty 2

## 2012-10-03 MED ORDER — DIPHENOXYLATE-ATROPINE 2.5-0.025 MG PO TABS: 1.0000 | ORAL_TABLET | Freq: Four times a day (QID) | ORAL | Status: DC | PRN

## 2012-10-03 NOTE — ED Notes (Signed)
Pt resting in bed. Eyes closed. resp even/nonlabored. Even/rise fall of chest. Sitter at bsd.

## 2012-10-03 NOTE — ED Notes (Signed)
Pt up to b/r at this time

## 2012-10-03 NOTE — ED Notes (Signed)
Pt requesting to take shower. No male tech/nurse available to assist with pt. Pt voiced he could wait until this evening shift came to have male assistance. Girlfriend at bsd. Pt resting.. Pt c/o generalized body aches along with diarrhea that began last evening. Will notify MD

## 2012-10-03 NOTE — [facility] Counselor (Signed)
Writer spoke to the nurse working with the patient (Amanda [phone]).  The nurse informed me that the patient did not want to go to [facility] because [facility] is not one of his approved sites for care since because he has [employer] insurance.  \  Writer will follow up with the other referral that previous counselors have made for the patient.  

## 2012-10-03 NOTE — ED Notes (Signed)
In with Dr. Blinda Leatherwood to discuss plan of care with pt.  Pt wants to leave, and then go to [place] first thing tomorrow morning in the care of his girlfriend.  Pt reports he has the help of [employer], and they are helping him with his care plan and "to get the ball rolling" at the [place].

## 2012-10-03 NOTE — ED Notes (Signed)
Called [facility] to check status of [facility].  Spoke with Ava at [facility]

## 2012-10-03 NOTE — ED Notes (Signed)
Spoke with Dr. Arman Bogus (pt's psychologist) via telephone with permission from pt.  She is willing to help in any manner she can to facilitate pt placement at [facility].  If needed, she can be reached at [phone].

## 2012-10-03 NOTE — ED Notes (Signed)
Left in c/o girlfriend for transport home.  Instructions, prescriptions and f/u information given/reviewed - verbalizes understanding.  Pt will go to [facility] first thing tomorrow morning with his girlfriend for treatment.

## 2012-10-03 NOTE — ED Notes (Signed)
Breakfast tray given and pt up to br this am. nad noted. Sitter at [employer]

## 2012-10-03 NOTE — ED Notes (Signed)
C/o nausea - vomited x 1 per sitter.  EDP notified and orders rec'd.

## 2012-10-03 NOTE — ED Provider Notes (Signed)
Patient asked to see me because he wants to be discharged. Patient is frustrated that things are going so [facility] with [facility] for [facility]. She does also become acutely you while here. He has had nausea, vomiting and diarrhea. I offered IV fluids and blood work, stool cultures, etc. for this, but he does not wish any further testing at this time. The patient is alert, oriented, calm and insightful. He denies any thoughts of harming himself or others. Significant other is at the bedside and agrees with this discharge. Patient reports that he will followup at the [place]. Medications will be filled, in addition to Lomotil and Zofran for his current symptoms. He was counseled that he can come back here anytime if he has any concerns about his safety or medical condition.  Gilda Crease, MD 10/03/12 [phone]

## 2012-10-03 NOTE — [facility] Counselor (Signed)
Writer spoke to [name] at the [facility] in [place].  Writer was informed that the RN working on the patients case would have to call me back regarding the patients status for inpatient hospitalization.   Writer contacted the nurse Marchelle Folks) working with the patient and informed her that I am waiting to hear back from the [name] with the [facility].

## 2013-12-05 ENCOUNTER — Encounter: Payer: Self-pay | Admitting: Family Medicine

## 2013-12-05 DIAGNOSIS — F329 Major depressive disorder, single episode, unspecified: Secondary | ICD-10-CM | POA: Insufficient documentation

## 2013-12-05 DIAGNOSIS — F419 Anxiety disorder, unspecified: Secondary | ICD-10-CM | POA: Insufficient documentation

## 2013-12-05 DIAGNOSIS — F32A Depression, unspecified: Secondary | ICD-10-CM | POA: Insufficient documentation

## 2013-12-05 DIAGNOSIS — K219 Gastro-esophageal reflux disease without esophagitis: Secondary | ICD-10-CM | POA: Insufficient documentation

## 2014-08-21 ENCOUNTER — Ambulatory Visit: Admitting: Physician Assistant

## 2016-09-15 ENCOUNTER — Emergency Department (HOSPITAL_COMMUNITY): Payer: Medicare Other

## 2016-09-15 ENCOUNTER — Emergency Department (HOSPITAL_COMMUNITY)
Admission: EM | Admit: 2016-09-15 | Discharge: 2016-09-15 | Disposition: A | Discharge status: Home / Self Care | Payer: Medicare Other | Attending: Emergency Medicine | Admitting: Emergency Medicine

## 2016-09-15 ENCOUNTER — Encounter (HOSPITAL_COMMUNITY): Payer: Self-pay

## 2016-09-15 DIAGNOSIS — Y92009 Unspecified place in unspecified non-institutional (private) residence as the place of occurrence of the external cause: Secondary | ICD-10-CM | POA: Insufficient documentation

## 2016-09-15 DIAGNOSIS — F17210 Nicotine dependence, cigarettes, uncomplicated: Secondary | ICD-10-CM | POA: Diagnosis not present

## 2016-09-15 DIAGNOSIS — Y939 Activity, unspecified: Secondary | ICD-10-CM | POA: Diagnosis not present

## 2016-09-15 DIAGNOSIS — S61210A Laceration without foreign body of right index finger without damage to nail, initial encounter: Secondary | ICD-10-CM | POA: Diagnosis not present

## 2016-09-15 DIAGNOSIS — Y999 Unspecified external cause status: Secondary | ICD-10-CM | POA: Diagnosis not present

## 2016-09-15 DIAGNOSIS — W230XXA Caught, crushed, jammed, or pinched between moving objects, initial encounter: Secondary | ICD-10-CM | POA: Insufficient documentation

## 2016-09-15 DIAGNOSIS — S61218A Laceration without foreign body of other finger without damage to nail, initial encounter: Secondary | ICD-10-CM

## 2016-09-15 DIAGNOSIS — Z79899 Other long term (current) drug therapy: Secondary | ICD-10-CM | POA: Insufficient documentation

## 2016-09-15 DIAGNOSIS — S6991XA Unspecified injury of right wrist, hand and finger(s), initial encounter: Secondary | ICD-10-CM | POA: Diagnosis present

## 2016-09-15 MED ORDER — CEPHALEXIN 500 MG PO CAPS: 500.0000 mg | ORAL_CAPSULE | Freq: Four times a day (QID) | ORAL | 0 refills | Status: DC

## 2016-09-15 MED ORDER — HYDROCODONE-ACETAMINOPHEN 5-325 MG PO TABS
1.0000 | ORAL_TABLET | Freq: Once | ORAL | Status: AC
  Administered 2016-09-15: 1 via ORAL
  Filled 2016-09-15: qty 1

## 2016-09-15 MED ORDER — LIDOCAINE HCL (PF) 1 % IJ SOLN
5.0000 mL | Freq: Once | INTRAMUSCULAR | Status: AC
  Administered 2016-09-15: 5 mL
  Filled 2016-09-15: qty 5

## 2016-09-15 MED ORDER — TETANUS-DIPHTH-ACELL PERTUSSIS 5-2.5-18.5 LF-MCG/0.5 IM SUSP
0.5000 mL | Freq: Once | INTRAMUSCULAR | Status: AC
  Administered 2016-09-15: 0.5 mL via INTRAMUSCULAR
  Filled 2016-09-15: qty 0.5

## 2016-09-15 NOTE — ED Provider Notes (Signed)
[facility] Provider Note   CSN: [id number] Arrival date & time: 09/15/16  1227     History   Chief Complaint Chief Complaint  Patient presents with  . Laceration    HPI Curtis [name] is a 41 y.o. male who presents to the ED for a right finger injury. The patient is right hand dominant. The injury occurred at home while the patient was working on a medal shelf when one side came down and mashed the finger. Patient is unsure of his last tetanus. He has had a previous injury to the same finger years ago.   HPI  Past Medical History:  Diagnosis Date  . Anxiety   . Depression   . Gastroparesis   . GERD (gastroesophageal reflux disease)   . Kidney stone   . PTSD (post-traumatic stress disorder)     Patient Active Problem List   Diagnosis Date Noted  . Anxiety   . Depression   . GERD (gastroesophageal reflux disease)     Past Surgical History:  Procedure Laterality Date  . ANKLE SURGERY    . APPENDECTOMY    . FRACTURE SURGERY    . KIDNEY STONE SURGERY    . VASECTOMY         Home Medications    Prior to Admission medications   Medication Sig Start Date End Date Taking? Authorizing Provider  cephALEXin (KEFLEX) 500 MG capsule Take 1 capsule (500 mg total) by mouth 4 (four) times daily. 09/15/16   Janne Napoleon, NP  diphenoxylate-atropine (LOMOTIL) 2.5-0.025 MG per tablet Take 1 tablet by mouth 4 (four) times daily as needed for diarrhea or loose stools. 10/03/12   Gilda Crease, MD  divalproex (DEPAKOTE ER) 500 MG 24 hr tablet Take 2 tablets (1,000 mg total) by mouth at bedtime. 10/03/12   Gilda Crease, MD  FLUoxetine (PROZAC) 40 MG capsule Take 1 capsule (40 mg total) by mouth daily. 10/03/12   Gilda Crease, MD  hydrOXYzine (ATARAX/VISTARIL) 25 MG tablet Take 1 tablet (25 mg total) by mouth at bedtime. 10/03/12   Gilda Crease, MD  ondansetron (ZOFRAN) 4 MG tablet Take 1 tablet (4 mg total) by mouth every 6 (six) hours. 10/03/12    Gilda Crease, MD  risperiDONE (RISPERDAL) 1 MG tablet Take 1 mg by mouth daily.    [provider]  traMADol (ULTRAM) 50 MG tablet Take 50 mg by mouth every 6 (six) hours as needed.    [provider]    Family History Family History  Problem Relation Age of Onset  . Arthritis Mother   . Miscarriages / [place] Mother   . Arthritis Father   . Hearing loss Father   . Hypertension Father   . Arthritis Maternal Grandmother   . Depression Maternal Grandmother   . Diabetes Maternal Grandfather   . Hearing loss Maternal Grandfather   . Hypertension Maternal Grandfather   . Mental illness Maternal Grandfather   . Stroke Maternal Grandfather   . Early death Paternal Grandfather     Social History Social History  Substance Use Topics  . Smoking status: Current Every Day Smoker    Packs/day: 0.50    Types: Cigarettes  . Smokeless tobacco: Never Used  . Alcohol use No     Allergies   Patient has no known allergies.   Review of Systems Review of Systems  Constitutional: Negative for chills and fever.  Gastrointestinal: Negative for nausea and vomiting.  Skin: Positive for wound.  Neurological: Negative for weakness and headaches.  Psychiatric/Behavioral: The patient is nervous/anxious (hx of).      Physical Exam Updated Vital Signs BP (!) 113/98 (BP Location: Right Arm)   Pulse (!) 114   Temp 98.3 F (36.8 C) (Oral)   Resp 18   Wt 90.7 kg (200 lb)   SpO2 99%   BMI 25.68 kg/m   Physical Exam  Constitutional: He is oriented to person, place, and time. He appears well-developed and well-nourished. No distress.  HENT:  Head: Normocephalic.  Eyes: EOM are normal.  Neck: Neck supple.  Cardiovascular: Normal rate.   Pulmonary/Chest: Effort normal.  Musculoskeletal: Normal range of motion.  Laceration to the distal aspect of the right index finger.   Neurological: He is alert and oriented to person, place, and time. No cranial nerve  deficit.  Skin: Skin is warm and dry.  3 cm flap laceration to the distal aspect of the right index finger.   Psychiatric: He has a normal mood and affect. His behavior is normal.  Nursing note and vitals reviewed.    ED Treatments / Results  Labs (all labs ordered are listed, but only abnormal results are displayed) Labs Reviewed - No data to display  Radiology Dg Finger Index Right  Result Date: 09/15/2016 CLINICAL DATA:  Laceration injury. EXAM: RIGHT INDEX FINGER 2+V COMPARISON:  No prior . FINDINGS: Laceration of the soft tissues of the distal aspect of the right second phalanx. No radiopaque foreign body . No acute bony abnormality. No evidence of fracture or dislocation. IMPRESSION: Laceration of the soft tissues the distal aspect of the right second phalanx. No radiopaque foreign body. No acute bony abnormality. Electronically Signed   By: Maisie Fushomas  Register   On: 09/15/2016 13:15    Procedures .Marland Kitchen.Laceration Repair Date/Time: 09/15/2016 2:00 PM Performed by: Janne NapoleonNEESE, Quinn Quam M Authorized by: Janne NapoleonNEESE, Amarisa Wilinski M   Consent:    Consent obtained:  Verbal   Consent given by:  Patient   Risks discussed:  Infection, pain and poor cosmetic result   Alternatives discussed:  No treatment Anesthesia (see MAR for exact dosages):    Anesthesia method:  Local infiltration   Local anesthetic:  Lidocaine 1% w/o epi Laceration details:    Location:  Finger   Length (cm):  3 Repair type:    Repair type:  Simple Pre-procedure details:    Preparation:  Patient was prepped and draped in usual sterile fashion and imaging obtained to evaluate for foreign bodies Exploration:    Hemostasis achieved with:  Direct pressure   Wound exploration: wound explored through full range of motion and entire depth of wound probed and visualized     Contaminated: yes   Treatment:    Area cleansed with:  Betadine   Amount of cleaning:  Standard   Irrigation solution:  Sterile saline   Irrigation method:  Syringe    Visualized foreign bodies/material removed: yes   Skin repair:    Repair method:  Sutures Approximation:    Approximation:  Loose   Vermilion border: well-aligned   Post-procedure details:    Dressing:  Antibiotic ointment and non-adherent dressing   Patient tolerance of procedure:  Procedure terminated at patient's request Comments:     Splint to finger, tetanus updated.   (including critical care time)  Medications Ordered in ED Medications  Tdap (BOOSTRIX) injection 0.5 mL (0.5 mLs Intramuscular Given 09/15/16 1256)  HYDROcodone-acetaminophen (NORCO/VICODIN) 5-325 MG per tablet 1 tablet (1 tablet Oral Given 09/15/16 1256)  lidocaine (  PF) (XYLOCAINE) 1 % injection 5 mL (5 mLs Infiltration Given 09/15/16 1308)     Initial Impression / Assessment and Plan / ED Course  I have reviewed the triage vital signs and the nursing notes.  Pertinent imaging results that were available during my care of the patient were reviewed by me and considered in my medical decision making (see chart for details).   Final Clinical Impressions(s) / ED Diagnoses  41 y.o. male with right index finger laceration stable for d/c without focal neuro deficits. Due to contamination of the wound will start Keflex. Patient to f/u in 7 to 10 days for suture removal. He will return sooner for any problems.  Final diagnoses:  Laceration of finger, index, initial encounter    New Prescriptions New Prescriptions   CEPHALEXIN (KEFLEX) 500 MG CAPSULE    Take 1 capsule (500 mg total) by mouth 4 (four) times daily.     Kerrie Buffalo [place], [place] 09/15/16 1406    Benjiman Core, MD 09/15/16 [phone]

## 2016-09-15 NOTE — ED Triage Notes (Signed)
Pt crushed finger with metal rack. One side of rack fell and caught right index finger. Bleeding controlled

## 2017-05-22 ENCOUNTER — Emergency Department (HOSPITAL_COMMUNITY): Payer: Medicare Other

## 2017-05-22 ENCOUNTER — Emergency Department (HOSPITAL_COMMUNITY)
Admission: EM | Admit: 2017-05-22 | Discharge: 2017-05-22 | Disposition: A | Discharge status: Home / Self Care | Payer: Medicare Other | Attending: Emergency Medicine | Admitting: Emergency Medicine

## 2017-05-22 ENCOUNTER — Other Ambulatory Visit: Payer: Self-pay

## 2017-05-22 ENCOUNTER — Encounter (HOSPITAL_COMMUNITY): Payer: Self-pay | Admitting: *Deleted

## 2017-05-22 DIAGNOSIS — R1031 Right lower quadrant pain: Secondary | ICD-10-CM | POA: Insufficient documentation

## 2017-05-22 DIAGNOSIS — R109 Unspecified abdominal pain: Secondary | ICD-10-CM

## 2017-05-22 DIAGNOSIS — Z79899 Other long term (current) drug therapy: Secondary | ICD-10-CM | POA: Insufficient documentation

## 2017-05-22 DIAGNOSIS — F17210 Nicotine dependence, cigarettes, uncomplicated: Secondary | ICD-10-CM | POA: Insufficient documentation

## 2017-05-22 DIAGNOSIS — M545 Low back pain: Secondary | ICD-10-CM | POA: Diagnosis present

## 2017-05-22 LAB — URINALYSIS, ROUTINE W REFLEX MICROSCOPIC
BILIRUBIN URINE: NEGATIVE
Glucose, UA: NEGATIVE mg/dL
HGB URINE DIPSTICK: NEGATIVE
Ketones, ur: NEGATIVE mg/dL
Leukocytes, UA: NEGATIVE
NITRITE: NEGATIVE
PROTEIN: NEGATIVE mg/dL
Specific Gravity, Urine: 1.006 (ref 1.005–1.030)
pH: 7 (ref 5.0–8.0)

## 2017-05-22 MED ORDER — HYDROCODONE-ACETAMINOPHEN 5-325 MG PO TABS: 1.0000 | ORAL_TABLET | ORAL | 0 refills | Status: DC | PRN

## 2017-05-22 MED ORDER — METHOCARBAMOL 500 MG PO TABS: 500.0000 mg | ORAL_TABLET | Freq: Four times a day (QID) | ORAL | 0 refills | Status: AC

## 2017-05-22 MED ORDER — KETOROLAC TROMETHAMINE 30 MG/ML IJ SOLN
30.0000 mg | Freq: Once | INTRAMUSCULAR | Status: AC
  Administered 2017-05-22: 30 mg via INTRAMUSCULAR
  Filled 2017-05-22: qty 1

## 2017-05-22 MED ORDER — MORPHINE SULFATE (PF) 4 MG/ML IV SOLN
6.0000 mg | Freq: Once | INTRAVENOUS | Status: AC
  Administered 2017-05-22: 6 mg via INTRAMUSCULAR
  Filled 2017-05-22: qty 2

## 2017-05-22 MED ORDER — ONDANSETRON 4 MG PO TBDP
4.0000 mg | ORAL_TABLET | Freq: Once | ORAL | Status: AC
  Administered 2017-05-22: 4 mg via ORAL
  Filled 2017-05-22: qty 1

## 2017-05-22 NOTE — ED Triage Notes (Signed)
Pt c/o right lower back pain x 4-5 days, worsening today. Denies urinary symptoms. Denies injury.

## 2017-05-22 NOTE — ED Provider Notes (Signed)
[facility] EMERGENCY DEPARTMENT Provider Note   CSN: [id number] Arrival date & time: 05/22/17  1323     History   Chief Complaint Chief Complaint  Patient presents with  . Back Pain    HPI Curtis [name] is a 42 y.o. male presenting with right lower back and flank pain which has been constant for the past 4-5 days, not worsened with movement or positional changes but also not consistent with his prior history of kidney stones (describes last uric acid stone was passed approx 9 years ago).  He denies dysuria or hematuria.  He reports intense pain, described as "the stitch you get in your side when you run too far" but lower and constant. Denies injury or overuse and denies history of low back pain problems. He also denies vomiting, diarrhea, fevers or chills, but has had some mild nausea, felt due to pain. He has found no alleviators for his symptoms.     The history is provided by the patient.    Past Medical History:  Diagnosis Date  . Anxiety   . Depression   . Gastroparesis   . GERD (gastroesophageal reflux disease)   . Kidney stone   . PTSD (post-traumatic stress disorder)     Patient Active Problem List   Diagnosis Date Noted  . Anxiety   . Depression   . GERD (gastroesophageal reflux disease)     Past Surgical History:  Procedure Laterality Date  . ANKLE SURGERY    . APPENDECTOMY    . FRACTURE SURGERY    . KIDNEY STONE SURGERY    . VASECTOMY          Home Medications    Prior to Admission medications   Medication Sig Start Date End Date Taking? Authorizing Provider  cephALEXin (KEFLEX) 500 MG capsule Take 1 capsule (500 mg total) by mouth 4 (four) times daily. 09/15/16   Janne Napoleon, NP  diphenoxylate-atropine (LOMOTIL) 2.5-0.025 MG per tablet Take 1 tablet by mouth 4 (four) times daily as needed for diarrhea or loose stools. 10/03/12   Gilda Crease, MD  divalproex (DEPAKOTE ER) 500 MG 24 hr tablet Take 2 tablets (1,000 mg total) by mouth at  bedtime. 10/03/12   Gilda Crease, MD  FLUoxetine (PROZAC) 40 MG capsule Take 1 capsule (40 mg total) by mouth daily. 10/03/12   Gilda Crease, MD  HYDROcodone-acetaminophen (NORCO/VICODIN) 5-325 MG tablet Take 1 tablet by mouth every 4 (four) hours as needed. 05/22/17   Burgess Amor, PA-C  hydrOXYzine (ATARAX/VISTARIL) 25 MG tablet Take 1 tablet (25 mg total) by mouth at bedtime. 10/03/12   Gilda Crease, MD  methocarbamol (ROBAXIN) 500 MG tablet Take 1 tablet (500 mg total) by mouth 4 (four) times daily for 10 days. 05/22/17 06/01/17  Burgess Amor, PA-C  ondansetron (ZOFRAN) 4 MG tablet Take 1 tablet (4 mg total) by mouth every 6 (six) hours. 10/03/12   Gilda Crease, MD  risperiDONE (RISPERDAL) 1 MG tablet Take 1 mg by mouth daily.    [provider]  traMADol (ULTRAM) 50 MG tablet Take 50 mg by mouth every 6 (six) hours as needed.    [provider]    Family History Family History  Problem Relation Age of Onset  . Arthritis Mother   . Miscarriages / [place] Mother   . Arthritis Father   . Hearing loss Father   . Hypertension Father   . Arthritis Maternal Grandmother   . Depression Maternal Grandmother   .  Diabetes Maternal Grandfather   . Hearing loss Maternal Grandfather   . Hypertension Maternal Grandfather   . Mental illness Maternal Grandfather   . Stroke Maternal Grandfather   . Early death Paternal Grandfather     Social History Social History   Tobacco Use  . Smoking status: Current Every Day Smoker    Packs/day: 0.50    Types: Cigarettes  . Smokeless tobacco: Never Used  Substance Use Topics  . Alcohol use: No  . Drug use: No     Allergies   Patient has no known allergies.   Review of Systems Review of Systems  Constitutional: Negative for fever.  HENT: Negative for congestion and sore throat.   Eyes: Negative.   Respiratory: Negative for chest tightness and shortness of breath.   Cardiovascular: Negative for  chest pain.  Gastrointestinal: Negative for abdominal pain and nausea.  Genitourinary: Positive for flank pain. Negative for decreased urine volume, dysuria and hematuria.  Musculoskeletal: Positive for back pain. Negative for arthralgias, joint swelling and neck pain.  Skin: Negative.  Negative for rash and wound.  Neurological: Negative for dizziness, weakness, light-headedness, numbness and headaches.  Psychiatric/Behavioral: Negative.      Physical Exam Updated Vital Signs BP 129/88 (BP Location: Right Arm)   Pulse 80   Temp 98.3 F (36.8 C) (Tympanic)   Resp 18   Ht 6\' 2"  (1.88 m)   Wt 97.5 kg (215 lb)   SpO2 99%   BMI 27.60 kg/m   Physical Exam  Constitutional: He appears well-developed and well-nourished.  HENT:  Head: Normocephalic.  Eyes: Conjunctivae are normal.  Neck: Normal range of motion. Neck supple.  Cardiovascular: Normal rate and intact distal pulses.  Pedal pulses normal.  Pulmonary/Chest: Effort normal.  Abdominal: Soft. Bowel sounds are normal. He exhibits no distension and no mass. There is no rigidity and no guarding.  Musculoskeletal: Normal range of motion. He exhibits no edema.       Lumbar back: He exhibits tenderness. He exhibits no bony tenderness, no swelling, no edema and no spasm.  Pt has pain at his right lower back soft tissue, but not reproducible on exam.  No midline pain.  No cva ttp.   Neurological: He is alert. He has normal strength. He displays no atrophy and no tremor. No sensory deficit. Gait normal.  No strength deficit noted in hip and knee flexor and extensor muscle groups.  Ankle flexion and extension intact.  Skin: Skin is warm and dry.  Psychiatric: He has a normal mood and affect.  Nursing note and vitals reviewed.    ED Treatments / Results  Labs (all labs ordered are listed, but only abnormal results are displayed) Labs Reviewed  URINALYSIS, ROUTINE W REFLEX MICROSCOPIC - Abnormal; Notable for the following  components:      Result Value   Color, Urine STRAW (*)    All other components within normal limits    EKG None  Radiology Ct Renal Stone Study  Result Date: 05/22/2017 CLINICAL DATA:  Right flank pain EXAM: CT ABDOMEN AND PELVIS WITHOUT CONTRAST TECHNIQUE: Multidetector CT imaging of the abdomen and pelvis was performed following the standard protocol without IV contrast. COMPARISON:  None. FINDINGS: Lower chest: Lung bases demonstrate no acute consolidation or effusion. Normal heart size. Hepatobiliary: No focal liver abnormality is seen. No gallstones, gallbladder wall thickening, or biliary dilatation. Pancreas: Unremarkable. No pancreatic ductal dilatation or surrounding inflammatory changes. Spleen: Normal in size without focal abnormality. Adrenals/Urinary Tract: Adrenal glands are  unremarkable. Kidneys are normal, without renal calculi, focal lesion, or hydronephrosis. Bladder is unremarkable. Stomach/Bowel: Stomach is within normal limits. Status post appendectomy. No evidence of bowel wall thickening, distention, or inflammatory changes. Vascular/Lymphatic: Mild aortic atherosclerosis. No aneurysmal dilatation. No significantly enlarged lymph nodes. Reproductive: Prostate is unremarkable. Other: Negative for free air or free fluid. Small fat in the umbilical region. Musculoskeletal: No acute or significant osseous findings. IMPRESSION: 1. Negative for hydronephrosis or ureteral stone. 2. No CT evidence for acute intra-abdominal or pelvic abnormality. Electronically Signed   By: Jasmine Pang M.D.   On: 05/22/2017 16:06    Procedures Procedures (including critical care time)  Medications Ordered in ED Medications  morphine 4 MG/ML injection 6 mg (6 mg Intramuscular Given 05/22/17 1652)  ondansetron (ZOFRAN-ODT) disintegrating tablet 4 mg (4 mg Oral Given 05/22/17 1651)  ketorolac (TORADOL) 30 MG/ML injection 30 mg (30 mg Intramuscular Given 05/22/17 1735)     Initial Impression /  Assessment and Plan / ED Course  I have reviewed the triage vital signs and the nursing notes.  Pertinent labs & imaging results that were available during my care of the patient were reviewed by me and considered in my medical decision making (see chart for details).     Pt with acute flank pain, but no findings suggesting ureteral stone. Possible ureteral colic, less likely mechanical low back pain given not reproducible or worsened with movement.  He was placed on hydrocodone, robaxin, heat tx. Close f/u with pcp prn.   After dc, patient asked about medication for his chest congestion, reports recent uri with residual nasal congestion and cough. Denies sob, fevers, wheezing.  Recommended claritin d, mucinex.  Final Clinical Impressions(s) / ED Diagnoses   Final diagnoses:  Flank pain, acute    ED Discharge Orders        Ordered    HYDROcodone-acetaminophen (NORCO/VICODIN) 5-325 MG tablet  Every 4 hours PRN     05/22/17 1729    methocarbamol (ROBAXIN) 500 MG tablet  4 times daily     05/22/17 1729       Burgess Amor, PA-C 05/22/17 1745    Molpus, Jonny Ruiz, MD 05/22/17 Barry Brunner

## 2017-05-22 NOTE — ED Notes (Signed)
Pt ambulates heel to toe with out stagger or drift, also erect but holding  His lumbar region

## 2017-05-22 NOTE — ED Notes (Signed)
Pt eating chips while  Speaking  [facility]  Flank pain for the last 4 days - pt has hx of kidney stones but states this does not feel as previous stones have felt

## 2017-05-22 NOTE — Discharge Instructions (Addendum)
As discussed your CT is negative as is your urinalysis with no obvious source found for your flank pain.  It is possible you have passed a kidney stone that is still causing discomfort (called ureteral colic).  It is also possible this is mechanical musculoskeletal low back pain (but I rather doubt this as it is not reproducible with movement or by palpating the site).  Use the medicines prescribed for your pain. You may also try a heating pad to your lower back which may help with discomfort.  Get rechecked for any symptoms that persist beyond the next several days with todays treatment.  You may take the hydrocodone prescribed for pain relief.  This will make you drowsy - do not drive within 4 hours of taking this medication.  Also as discussed,  you may try Claritin D and Mucinex (generics for these medicines) for your congestion symptoms.

## 2017-08-31 ENCOUNTER — Other Ambulatory Visit: Payer: Self-pay

## 2017-08-31 ENCOUNTER — Encounter (HOSPITAL_COMMUNITY): Payer: Self-pay | Admitting: Emergency Medicine

## 2017-08-31 ENCOUNTER — Emergency Department (HOSPITAL_COMMUNITY)
Admission: EM | Admit: 2017-08-31 | Discharge: 2017-08-31 | Disposition: A | Discharge status: Home / Self Care | Payer: Medicare Other | Attending: Emergency Medicine | Admitting: Emergency Medicine

## 2017-08-31 ENCOUNTER — Emergency Department (HOSPITAL_COMMUNITY): Payer: Medicare Other

## 2017-08-31 DIAGNOSIS — F17210 Nicotine dependence, cigarettes, uncomplicated: Secondary | ICD-10-CM | POA: Diagnosis not present

## 2017-08-31 DIAGNOSIS — R079 Chest pain, unspecified: Secondary | ICD-10-CM | POA: Insufficient documentation

## 2017-08-31 LAB — CBC
HEMATOCRIT: 42.6 % (ref 39.0–52.0)
Hemoglobin: 15 g/dL (ref 13.0–17.0)
MCH: 31.7 pg (ref 26.0–34.0)
MCHC: 35.2 g/dL (ref 30.0–36.0)
MCV: 90.1 fL (ref 78.0–100.0)
PLATELETS: 336 10*3/uL (ref 150–400)
RBC: 4.73 MIL/uL (ref 4.22–5.81)
RDW: 13 % (ref 11.5–15.5)
WBC: 6.7 10*3/uL (ref 4.0–10.5)

## 2017-08-31 LAB — I-STAT TROPONIN, ED: TROPONIN I, POC: 0.02 ng/mL (ref 0.00–0.08)

## 2017-08-31 LAB — BASIC METABOLIC PANEL
Anion gap: 9 (ref 5–15)
BUN: 17 mg/dL (ref 6–20)
CALCIUM: 9.4 mg/dL (ref 8.9–10.3)
CO2: 25 mmol/L (ref 22–32)
CREATININE: 1.03 mg/dL (ref 0.61–1.24)
Chloride: 105 mmol/L (ref 98–111)
GFR calc Af Amer: >60 mL/min (ref >60)
Glucose, Bld: 98 mg/dL (ref 70–99)
POTASSIUM: 3.5 mmol/L (ref 3.5–5.1)
Sodium: 139 mmol/L (ref 135–145)

## 2017-08-31 LAB — TROPONIN I: Troponin I: <0.03 ng/mL (ref <0.03)

## 2017-08-31 MED ORDER — ASPIRIN 81 MG PO CHEW
324.0000 mg | CHEWABLE_TABLET | Freq: Once | ORAL | Status: AC
  Administered 2017-08-31: 324 mg via ORAL
  Filled 2017-08-31: qty 4

## 2017-08-31 MED ORDER — OXYCODONE HCL 5 MG PO TABS
10.0000 mg | ORAL_TABLET | Freq: Once | ORAL | Status: AC
  Administered 2017-08-31: 10 mg via ORAL
  Filled 2017-08-31: qty 2

## 2017-08-31 NOTE — ED Notes (Signed)
Patient up to bathroom

## 2017-08-31 NOTE — ED Triage Notes (Signed)
Patient complaining of left sided chest pain radiating into left neck, shoulder, and left arm starting 2 hours ago.

## 2017-08-31 NOTE — Discharge Instructions (Addendum)
You were evaluated at the [facility].  After careful evaluation, we did not find any emergent condition requiring admission or further testing in the hospital.  Your tests today do not reveal any heart damage but we still recommend follow up with a cardiologist for stress testing within the next 2-3 weeks.  You may also have early obstructive lung disease or COPD, please discuss pulmonary function testing with your regular doctor.  Please return to the Emergency Department if you experience any worsening of your condition.  We encourage you to follow up with a primary care provider.  Thank you for allowing us to be a part of your care.
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
## 2019-07-08 IMAGING — CT CT RENAL STONE PROTOCOL
2 of 4 series · 17 of 46 positions shown, 19 images · non-contrast
Comparison: None.

CLINICAL DATA: Right flank pain

EXAM:
CT ABDOMEN AND PELVIS WITHOUT CONTRAST
TECHNIQUE: Multidetector CT imaging of the abdomen and pelvis was performed
following the standard protocol without IV contrast.

[Series 2: axial st · axial · 0.80mm/px · z∈[+814,+1244]mm · 14 of 94 slices shown, 16 images]
[im 4/94  soft-tissue]
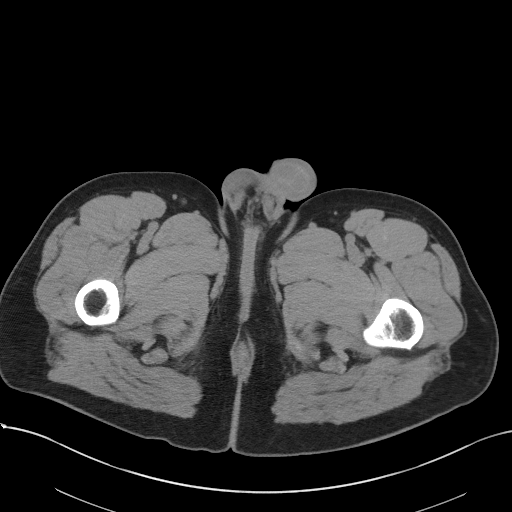
[im 4/94  bone]
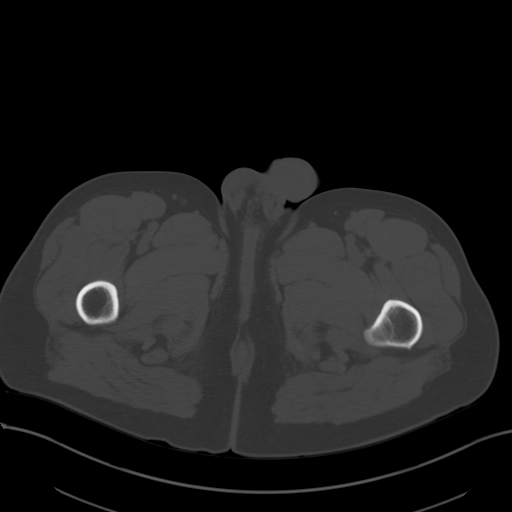
[im 12/94  soft-tissue]
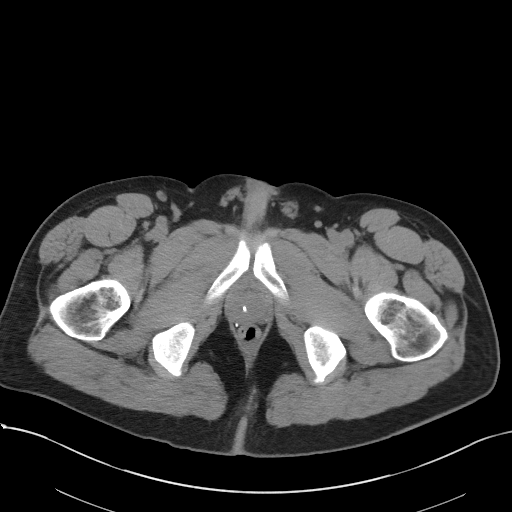
[im 19/94  soft-tissue]
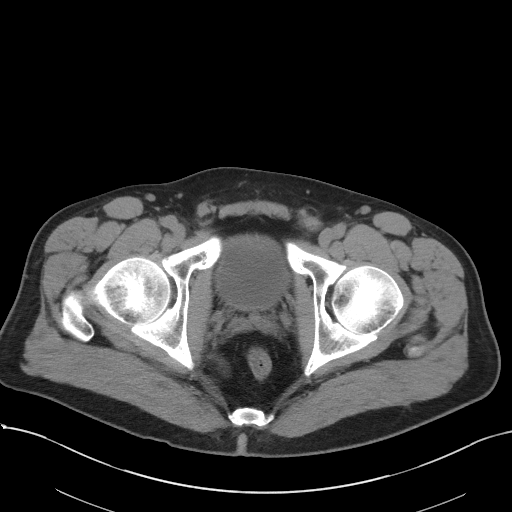
[im 27/94  soft-tissue]
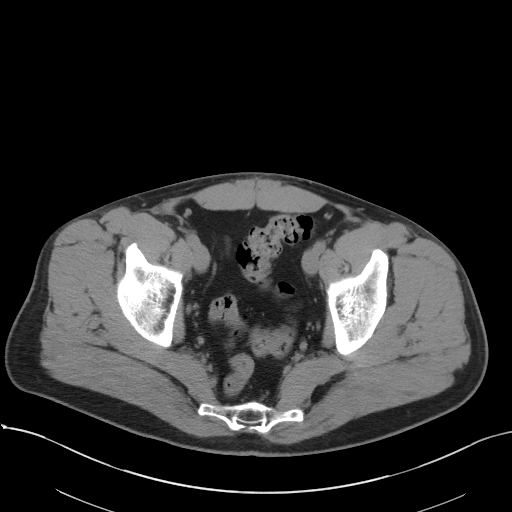
[im 30/94  soft-tissue]
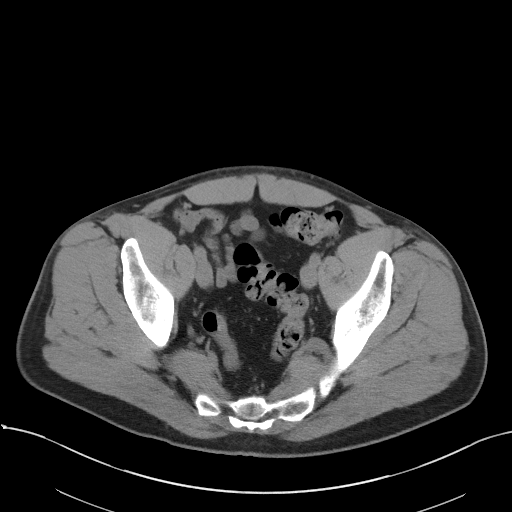
[im 38/94  soft-tissue]
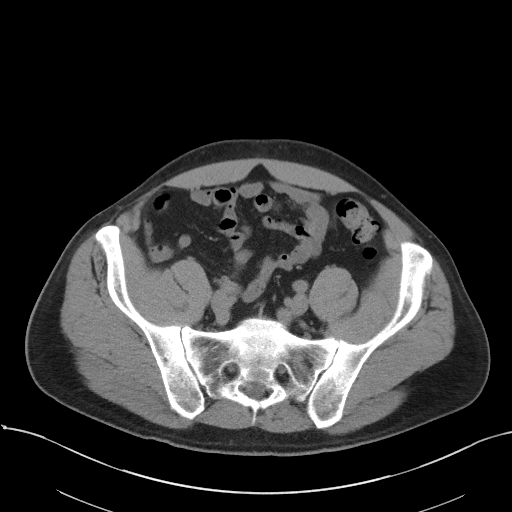
[im 45/94  soft-tissue]
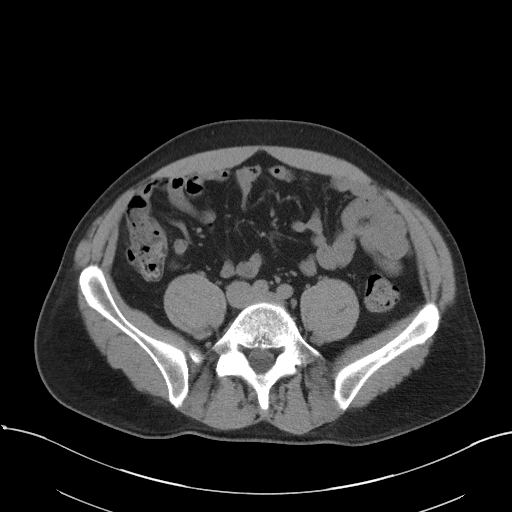
[im 49/94  soft-tissue]
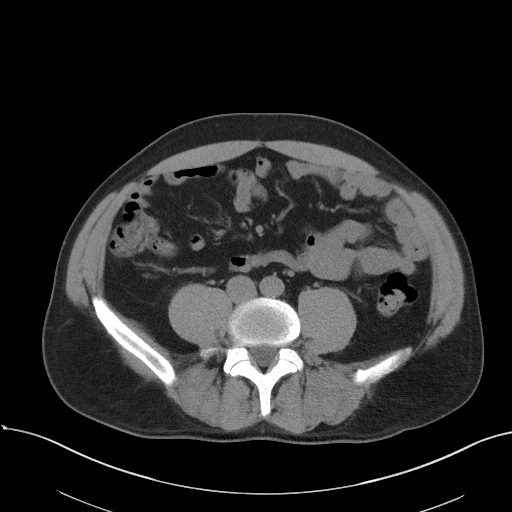
[im 56/94  soft-tissue]
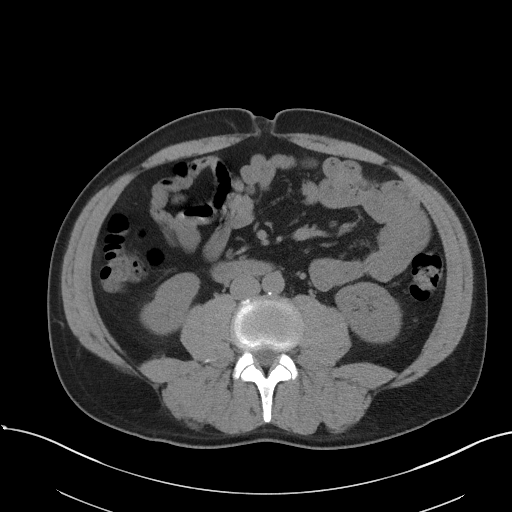
[im 56/94  bone]
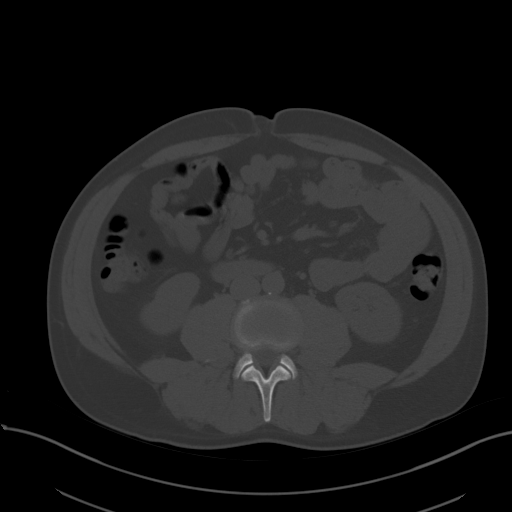
[im 64/94  soft-tissue]
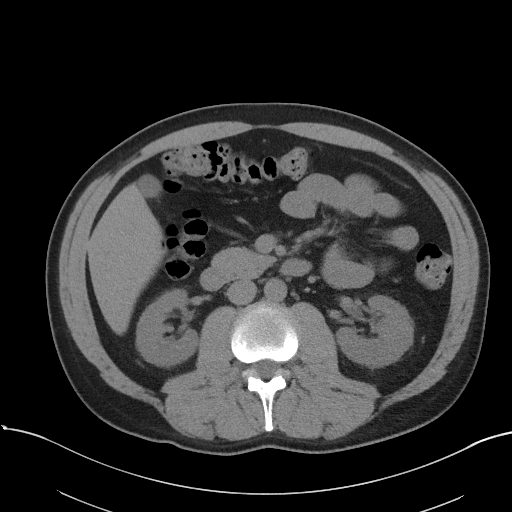
[im 71/94  soft-tissue]
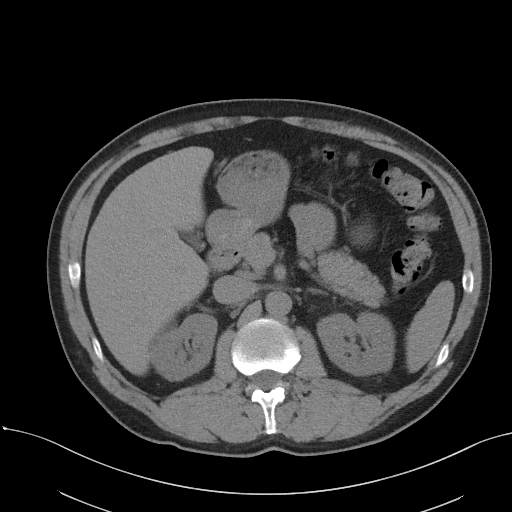
[im 75/94  soft-tissue]
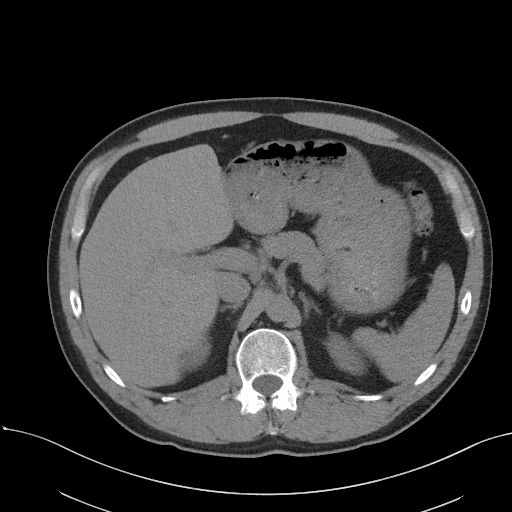
[im 82/94  soft-tissue]
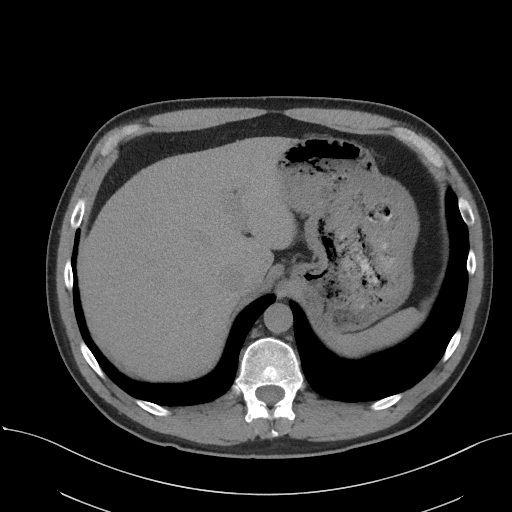
[im 90/94  soft-tissue]
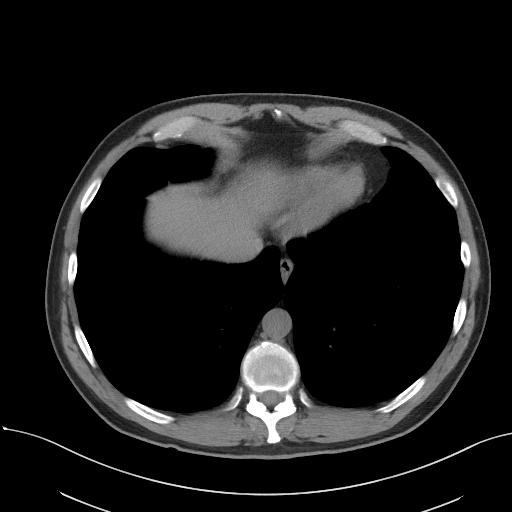

[Series 5: coronal st · coronal · 0.79mm/px · 3 of 101 slices shown]
[im 34/101  soft-tissue]
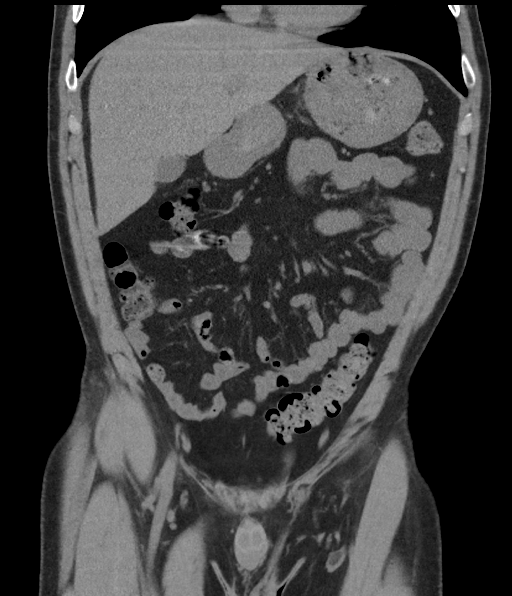
[im 45/101  soft-tissue]
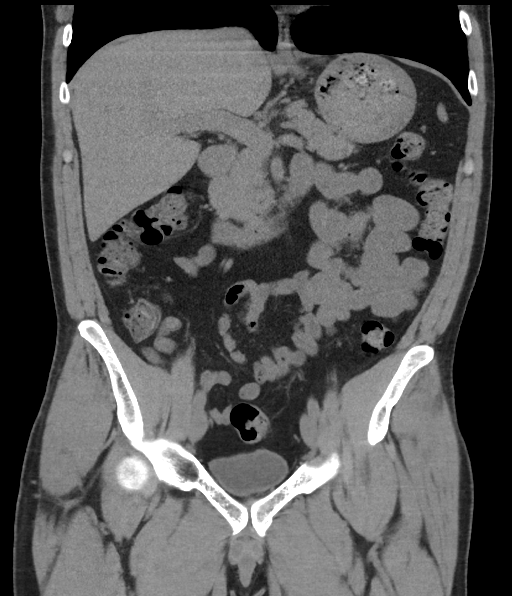
[im 56/101  soft-tissue]
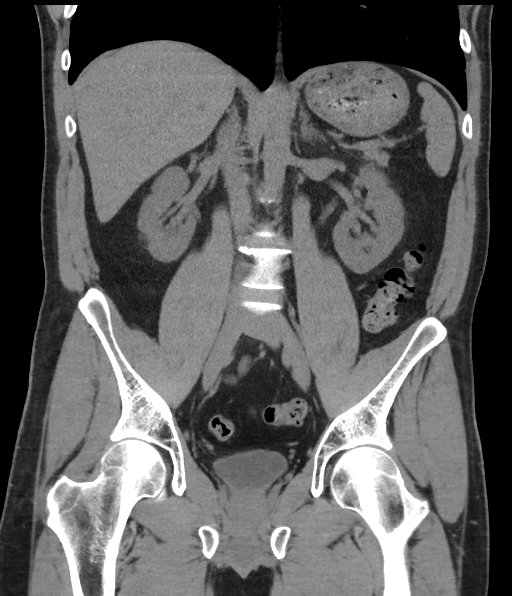

[17 of 46 positions shown; findings below may reference images not displayed]

FINDINGS: Lower chest: Lung bases demonstrate no acute consolidation or
effusion. Normal heart size.

Hepatobiliary: No focal liver abnormality is seen. No gallstones,
gallbladder wall thickening, or biliary dilatation.

Pancreas: Unremarkable. No pancreatic ductal dilatation or
surrounding inflammatory changes.

Spleen: Normal in size without focal abnormality.

Adrenals/Urinary Tract: Adrenal glands are unremarkable. Kidneys are
normal, without renal calculi, focal lesion, or hydronephrosis.
Bladder is unremarkable.

Stomach/Bowel: Stomach is within normal limits. Status post
appendectomy. No evidence of bowel wall thickening, distention, or
inflammatory changes.

Vascular/Lymphatic: Mild aortic atherosclerosis. No aneurysmal
dilatation. No significantly enlarged lymph nodes.

Reproductive: Prostate is unremarkable.

Other: Negative for free air or free fluid. Small fat in the
umbilical region.

Musculoskeletal: No acute or significant osseous findings.
IMPRESSION: 1. Negative for hydronephrosis or ureteral stone.
2. No CT evidence for acute intra-abdominal or pelvic abnormality.

## 2019-08-09 ENCOUNTER — Encounter: Payer: Self-pay | Admitting: Emergency Medicine

## 2019-08-09 ENCOUNTER — Emergency Department
Admission: EM | Admit: 2019-08-09 | Discharge: 2019-08-11 | Disposition: A | Discharge status: Other Acute Inpatient Hospital | Payer: Medicare Other | Attending: Emergency Medicine | Admitting: Emergency Medicine

## 2019-08-09 DIAGNOSIS — R45851 Suicidal ideations: Secondary | ICD-10-CM | POA: Diagnosis not present

## 2019-08-09 DIAGNOSIS — F17210 Nicotine dependence, cigarettes, uncomplicated: Secondary | ICD-10-CM | POA: Insufficient documentation

## 2019-08-09 DIAGNOSIS — F32A Depression, unspecified: Secondary | ICD-10-CM | POA: Diagnosis present

## 2019-08-09 DIAGNOSIS — Z79899 Other long term (current) drug therapy: Secondary | ICD-10-CM | POA: Insufficient documentation

## 2019-08-09 DIAGNOSIS — Z20822 Contact with and (suspected) exposure to covid-19: Secondary | ICD-10-CM | POA: Diagnosis not present

## 2019-08-09 DIAGNOSIS — F4310 Post-traumatic stress disorder, unspecified: Secondary | ICD-10-CM | POA: Insufficient documentation

## 2019-08-09 DIAGNOSIS — F329 Major depressive disorder, single episode, unspecified: Secondary | ICD-10-CM | POA: Diagnosis not present

## 2019-08-09 DIAGNOSIS — F419 Anxiety disorder, unspecified: Secondary | ICD-10-CM | POA: Diagnosis present

## 2019-08-09 LAB — URINALYSIS, COMPLETE (UACMP) WITH MICROSCOPIC
Bacteria, UA: NONE SEEN
Bilirubin Urine: NEGATIVE
Glucose, UA: NEGATIVE mg/dL
Hgb urine dipstick: NEGATIVE
Ketones, ur: NEGATIVE mg/dL
Leukocytes,Ua: NEGATIVE
Nitrite: NEGATIVE
Protein, ur: NEGATIVE mg/dL
Specific Gravity, Urine: 1.006 (ref 1.005–1.030)
Squamous Epithelial / LPF: NONE SEEN (ref 0–5)
pH: 6 (ref 5.0–8.0)

## 2019-08-09 LAB — CBC WITH DIFFERENTIAL/PLATELET
Abs Immature Granulocytes: 0.03 10*3/uL (ref 0.00–0.07)
Basophils Absolute: 0.1 10*3/uL (ref 0.0–0.1)
Basophils Relative: 1 %
Eosinophils Absolute: 0.1 10*3/uL (ref 0.0–0.5)
Eosinophils Relative: 1 %
HCT: 43.9 % (ref 39.0–52.0)
Hemoglobin: 15.4 g/dL (ref 13.0–17.0)
Immature Granulocytes: 0 %
Lymphocytes Relative: 29 %
Lymphs Abs: 2.4 10*3/uL (ref 0.7–4.0)
MCH: 30.6 pg (ref 26.0–34.0)
MCHC: 35.1 g/dL (ref 30.0–36.0)
MCV: 87.3 fL (ref 80.0–100.0)
Monocytes Absolute: 0.6 10*3/uL (ref 0.1–1.0)
Monocytes Relative: 7 %
Neutro Abs: 5.1 10*3/uL (ref 1.7–7.7)
Neutrophils Relative %: 62 %
Platelets: 326 10*3/uL (ref 150–400)
RBC: 5.03 MIL/uL (ref 4.22–5.81)
RDW: 12.1 % (ref 11.5–15.5)
WBC: 8.2 10*3/uL (ref 4.0–10.5)
nRBC: 0 % (ref 0.0–0.2)

## 2019-08-09 LAB — BASIC METABOLIC PANEL
Anion gap: 10 (ref 5–15)
BUN: 18 mg/dL (ref 6–20)
CO2: 24 mmol/L (ref 22–32)
Calcium: 9.6 mg/dL (ref 8.9–10.3)
Chloride: 105 mmol/L (ref 98–111)
Creatinine, Ser: 1.25 mg/dL — ABNORMAL HIGH (ref 0.61–1.24)
GFR calc Af Amer: >60 mL/min (ref >60)
GFR calc non Af Amer: >60 mL/min (ref >60)
Glucose, Bld: 101 mg/dL — ABNORMAL HIGH (ref 70–99)
Potassium: 3.9 mmol/L (ref 3.5–5.1)
Sodium: 139 mmol/L (ref 135–145)

## 2019-08-09 LAB — ACETAMINOPHEN LEVEL: Acetaminophen (Tylenol), Serum: <10 ug/mL — ABNORMAL LOW (ref 10–30)

## 2019-08-09 LAB — URINE DRUG SCREEN, QUALITATIVE (ARMC ONLY)
Amphetamines, Ur Screen: NOT DETECTED
Barbiturates, Ur Screen: NOT DETECTED
Benzodiazepine, Ur Scrn: NOT DETECTED
Cannabinoid 50 Ng, Ur ~~LOC~~: NOT DETECTED
Cocaine Metabolite,Ur ~~LOC~~: NOT DETECTED
MDMA (Ecstasy)Ur Screen: NOT DETECTED
Methadone Scn, Ur: NOT DETECTED
Opiate, Ur Screen: NOT DETECTED
Phencyclidine (PCP) Ur S: NOT DETECTED
Tricyclic, Ur Screen: NOT DETECTED

## 2019-08-09 LAB — ETHANOL: Alcohol, Ethyl (B): <10 mg/dL (ref <10)

## 2019-08-09 LAB — SALICYLATE LEVEL: Salicylate Lvl: <7 mg/dL — ABNORMAL LOW (ref 7.0–30.0)

## 2019-08-09 MED ORDER — TRAZODONE HCL 100 MG PO TABS
100.0000 mg | ORAL_TABLET | Freq: Every evening | ORAL | Status: DC | PRN
  Administered 2019-08-09 – 2019-08-10 (×2): 100 mg via ORAL
  Filled 2019-08-09 (×2): qty 1

## 2019-08-09 MED ORDER — LORAZEPAM 1 MG PO TABS
1.0000 mg | ORAL_TABLET | Freq: Once | ORAL | Status: AC
  Administered 2019-08-09: 1 mg via ORAL
  Filled 2019-08-09: qty 1

## 2019-08-09 NOTE — ED Provider Notes (Signed)
[facility] Department Provider Note   ____________________________________________   First MD Initiated Contact with Patient 08/09/19 1717     (approximate)  I have reviewed the triage vital signs and the nursing notes.   HISTORY  Chief Complaint Voluntary and Mental Evaluation    HPI Curtis [name] is a 44 y.o. male with past medical history of PTSD, depression, and GERD who presents to the ED complaining of depression.  Patient reports that his mood has been up and down over the past 1 to 2 weeks and that he now feels that he needs help with his mental health.  He reports fleeting thoughts of ending his life but states he would never act on them and denies any homicidal ideation.  He denies any auditory or visual hallucinations.  He does state that he took "almost 20" hydroxyzine yesterday to help with things anxiety and to help him sleep.  He denies any intent to harm himself by doing so.  He complains of some pain in the middle of his back, but denies any numbness or weakness in his legs, dysuria, or hematuria.        Past Medical History:  Diagnosis Date  . Anxiety   . Depression   . Gastroparesis   . GERD (gastroesophageal reflux disease)   . Kidney stone   . PTSD (post-traumatic stress disorder)     Patient Active Problem List   Diagnosis Date Noted  . Anxiety   . Depression   . GERD (gastroesophageal reflux disease)     Past Surgical History:  Procedure Laterality Date  . ANKLE SURGERY    . APPENDECTOMY    . FRACTURE SURGERY    . KIDNEY STONE SURGERY    . VASECTOMY      Prior to Admission medications   Medication Sig Start Date End Date Taking? Authorizing Provider  albuterol (PROVENTIL HFA;VENTOLIN HFA) 108 (90 Base) MCG/ACT inhaler Inhale 1-2 puffs into the lungs every 6 (six) hours as needed for wheezing or shortness of breath.    [provider]  cephALEXin (KEFLEX) 500 MG capsule Take 1 capsule (500 mg  total) by mouth 4 (four) times daily. Patient not taking: Reported on 08/31/2017 09/15/16   Janne Napoleon, NP  diphenoxylate-atropine (LOMOTIL) 2.5-0.025 MG per tablet Take 1 tablet by mouth 4 (four) times daily as needed for diarrhea or loose stools. Patient not taking: Reported on 08/31/2017 10/03/12   Gilda Crease, MD  divalproex (DEPAKOTE ER) 500 MG 24 hr tablet Take 2 tablets (1,000 mg total) by mouth at bedtime. Patient not taking: Reported on 08/31/2017 10/03/12   Gilda Crease, MD  FLUoxetine (PROZAC) 40 MG capsule Take 1 capsule (40 mg total) by mouth daily. Patient not taking: Reported on 08/31/2017 10/03/12   Gilda Crease, MD  HYDROcodone-acetaminophen (NORCO/VICODIN) 5-325 MG tablet Take 1 tablet by mouth every 4 (four) hours as needed. Patient not taking: Reported on 08/31/2017 05/22/17   Burgess Amor, PA-C  hydrOXYzine (ATARAX/VISTARIL) 25 MG tablet Take 1 tablet (25 mg total) by mouth at bedtime. Patient not taking: Reported on 08/31/2017 10/03/12   Gilda Crease, MD  Hypromell-Glycerin-Naphazoline (CLEAR EYES FOR DRY EYES PLUS OP) Apply 1 drop to eye daily.    [provider]  ondansetron (ZOFRAN) 4 MG tablet Take 1 tablet (4 mg total) by mouth every 6 (six) hours. Patient not taking: Reported on 08/31/2017 10/03/12   Gilda Crease, MD  pantoprazole (PROTONIX) 40 MG  tablet Take 40 mg by mouth daily.    [provider]  sertraline (ZOLOFT) 100 MG tablet Take 100 mg by mouth daily.    [provider]    Allergies Patient has no known allergies.  Family History  Problem Relation Age of Onset  . Arthritis Mother   . Miscarriages / [place] Mother   . Arthritis Father   . Hearing loss Father   . Hypertension Father   . Arthritis Maternal Grandmother   . Depression Maternal Grandmother   . Diabetes Maternal Grandfather   . Hearing loss Maternal Grandfather   . Hypertension Maternal Grandfather   . Mental illness Maternal  Grandfather   . Stroke Maternal Grandfather   . Early death Paternal Grandfather     Social History Social History   Tobacco Use  . Smoking status: Current Every Day Smoker    Packs/day: 0.50    Types: Cigarettes  . Smokeless tobacco: Never Used  Substance Use Topics  . Alcohol use: No  . Drug use: No    Review of Systems  Constitutional: No fever/chills Eyes: No visual changes. ENT: No sore throat. Cardiovascular: Denies chest pain. Respiratory: Denies shortness of breath. Gastrointestinal: No abdominal pain.  No nausea, no vomiting.  No diarrhea.  No constipation. Genitourinary: Negative for dysuria. Musculoskeletal: Positive for back pain. Skin: Negative for rash. Neurological: Negative for headaches, focal weakness or numbness.  Positive for depression.  ____________________________________________   PHYSICAL EXAM:  VITAL SIGNS: ED Triage Vitals  Enc Vitals Group     BP 08/09/19 1713 (!) 139/104     Pulse Rate 08/09/19 1713 93     Resp 08/09/19 1713 18     Temp 08/09/19 1713 97.9 F (36.6 C)     Temp Source 08/09/19 1713 Oral     SpO2 08/09/19 1713 99 %     Weight 08/09/19 1717 215 lb (97.5 kg)     Height 08/09/19 1717 6\' 2"  (1.88 m)     Head Circumference --      Peak Flow --      Pain Score 08/09/19 1717 2     Pain Loc --      Pain Edu? --      Excl. in GC? --     Constitutional: Alert and oriented. Eyes: Conjunctivae are normal. Head: Atraumatic. Nose: No congestion/rhinnorhea. Mouth/Throat: Mucous membranes are moist. Neck: Normal ROM Cardiovascular: Normal rate, regular rhythm. Grossly normal heart sounds. Respiratory: Normal respiratory effort.  No retractions. Lungs CTAB. Gastrointestinal: Soft and nontender. No distention.  No CVA tenderness bilaterally. Genitourinary: deferred Musculoskeletal: No lower extremity tenderness nor edema.  No midline thoracic or lumbar spinal tenderness. Neurologic:  Normal speech and language. No gross focal  neurologic deficits are appreciated. Skin:  Skin is warm, dry and intact. No rash noted. Psychiatric: Mood and affect are normal. Speech and behavior are normal.  ____________________________________________   LABS (all labs ordered are listed, but only abnormal results are displayed)  Labs Reviewed  BASIC METABOLIC PANEL - Abnormal; Notable for the following components:      Result Value   Glucose, Bld 101 (*)    Creatinine, Ser 1.25 (*)    All other components within normal limits  ACETAMINOPHEN LEVEL - Abnormal; Notable for the following components:   Acetaminophen (Tylenol), Serum <10 (*)    All other components within normal limits  SALICYLATE LEVEL - Abnormal; Notable for the following components:   Salicylate Lvl <7.0 (*)    All other  components within normal limits  CBC WITH DIFFERENTIAL/PLATELET  ETHANOL  URINALYSIS, COMPLETE (UACMP) WITH MICROSCOPIC  URINE DRUG SCREEN, QUALITATIVE ([facility] ONLY)     PROCEDURES  Procedure(s) performed (including Critical Care):  Procedures  ED ECG REPORT I, Chesley Noon, the attending physician, personally viewed and interpreted this ECG.   Date: 08/09/2019  EKG Time: 18:34  Rate: 81  Rhythm: normal sinus rhythm  Axis: Normal  Intervals:none  ST&T Change: None  ____________________________________________   INITIAL IMPRESSION / ASSESSMENT AND PLAN / ED COURSE       44 year old male with possible history of PTSD, depression, and GERD presents to the ED complaining of depressed mood over the past couple of weeks with vague thoughts of suicide.  He denies any actual intent to harm himself, but then admits that he overdosed on Atarax yesterday to "help with my anxiety and sleep".  He does not seem to have suffered any ill effects from this and currently has no symptoms concerning for anticholinergic toxicity.  We will screen EKG and labs, if these are unremarkable he may be medically cleared for psychiatric evaluation.  Given  under overdose with unclear intent, patient was placed under IVC.  The patient has been placed in psychiatric observation due to the need to provide a safe environment for the patient while obtaining psychiatric consultation and evaluation, as well as ongoing medical and medication management to treat the patient's condition.  The patient has been placed under full IVC at this time.  ----------------------------------------- 7:53 PM on 08/09/2019 -----------------------------------------  Lab work is reassuring, EKG shows no evidence of arrhythmia or ischemia.  Patient is medically cleared for psychiatric evaluation.       ____________________________________________   FINAL CLINICAL IMPRESSION(S) / ED DIAGNOSES  Final diagnoses:  Suicidal ideation     ED Discharge Orders    None       Note:  This document was prepared using Dragon voice recognition software and may include unintentional dictation errors.   Chesley Noon, MD 08/09/19 [phone]

## 2019-08-09 NOTE — ED Notes (Signed)
PT given a [place] tray and 480 mL of sprite

## 2019-08-09 NOTE — Consult Note (Signed)
[facility] Face-to-Face Psychiatry Consult   Reason for Consult:  Psychiatric evaluation  Referring Physician:  Dr. Larinda Buttery Patient Identification: Curtis [name] MRN:  [id number] Principal Diagnosis: Anxiety Diagnosis:  Principal Problem:   Anxiety Active Problems:   Depression   Total Time spent with patient: 45 minutes  Subjective:  "I'm here because I just need help"   HPI: Curtis [name], 44 y.o., male patient seen face to face by this provider; chart reviewed and consulted with Dr. Lucianne Muss on 08/10/19.  On evaluation Curtis [name] reports per TTS, Curtis [name] is a 45 y.o. male with past medical history of PTSD, depression, and GERD who presents to the ED complaining of depression.  Patient reports that his mood has been up and down over the past 1 to 2 weeks and that he now feels that he needs help with his mental health.  He reports fleeting thoughts of ending his life but states he would never act on them and denies any homicidal ideation.  He denies any auditory or visual hallucinations.  He does state that he took "almost 20" hydroxyzine yesterday to help with things anxiety and to help him sleep.  He denies any intent to harm himself by doing so.  He complains of some pain in the middle of his back, but denies any numbness or weakness in his legs, dysuria, or hematuria.    During evaluation Curtis [name] is sitting up in the bed ;he is alert/oriented x 4; labile/ aggitated/cooperative; and mood congruent with affect.  Patient is speaking in a clear tone at moderate volume, and normal pace; with fair eye contact.  His thought process is coherent and relevant; There is no indication that he is currently responding to internal/external stimuli or experiencing delusional thought content.  Patient denies suicidal/self-harm ideation but states that dont think the world would miss him if he died. The is no indication of , psychosis, or  paranoia.  Patient has answered questions appropriately.    Recommendation: Re-evaluation in the am.   Past Psychiatric History: MDD  Risk to Self: Suicidal Ideation: No-Not Currently/Within Last 6 Months Suicidal Intent: No-Not Currently/Within Last 6 Months Is patient at risk for suicide?: No Suicidal Plan?: No Access to Means: No What has been your use of drugs/alcohol within the last 12 months?: None How many times?: 1 Other Self Harm Risks: None Triggers for Past Attempts: None known Intentional Self Injurious Behavior: None Risk to Others: Homicidal Ideation: No Thoughts of Harm to Others: No Current Homicidal Intent: No Current Homicidal Plan: No Access to Homicidal Means: No Identified Victim: None History of harm to others?: No Assessment of Violence: None Noted Violent Behavior Description: None Does patient have access to weapons?: No Criminal Charges Pending?: No Does patient have a court date: No Prior Inpatient Therapy: Prior Inpatient Therapy: Yes Prior Therapy Dates: 2014 Prior Therapy Facilty/Provider(s): Unknown Reason for Treatment: "Same thing" mood swings Prior Outpatient Therapy: Prior Outpatient Therapy: Yes Prior Therapy Dates: Current Prior Therapy Facilty/Provider(s): [facility] Reason for Treatment: PTSD, Depression,  Does patient have an ACCT team?: No Does patient have Intensive In-House Services?  : No Does patient have [facility] services? : No Does patient have P4CC services?: No  Past Medical History:  Past Medical History:  Diagnosis Date  . Anxiety   . Depression   . Gastroparesis   . GERD (gastroesophageal reflux disease)   . Kidney stone   . PTSD (post-traumatic stress disorder)     Past Surgical  History:  Procedure Laterality Date  . ANKLE SURGERY    . APPENDECTOMY    . FRACTURE SURGERY    . KIDNEY STONE SURGERY    . VASECTOMY     Family History:  Family History  Problem Relation Age of Onset  . Arthritis Mother   . Miscarriages / [place] Mother   . Arthritis Father    . Hearing loss Father   . Hypertension Father   . Arthritis Maternal Grandmother   . Depression Maternal Grandmother   . Diabetes Maternal Grandfather   . Hearing loss Maternal Grandfather   . Hypertension Maternal Grandfather   . Mental illness Maternal Grandfather   . Stroke Maternal Grandfather   . Early death Paternal Grandfather    Family Psychiatric  History: unknown Social History:  Social History   Substance and Sexual Activity  Alcohol Use No     Social History   Substance and Sexual Activity  Drug Use No    Social History   Socioeconomic History  . Marital status: Married    Spouse name: Not on file  . Number of children: Not on file  . Years of education: Not on file  . Highest education level: Not on file  Occupational History  . Not on file  Tobacco Use  . Smoking status: Current Every Day Smoker    Packs/day: 0.50    Types: Cigarettes  . Smokeless tobacco: Never Used  Substance and Sexual Activity  . Alcohol use: No  . Drug use: No  . Sexual activity: Yes    Birth control/protection: Surgical  Other Topics Concern  . Not on file  Social History Narrative  . Not on file   Social Determinants of Health   Financial Resource Strain:   . Difficulty of Paying Living Expenses:   Food Insecurity:   . Worried About Programme researcher, broadcasting/film/video in the Last Year:   . Barista in the Last Year:   Transportation Needs:   . Freight forwarder (Medical):   Marland Kitchen Lack of Transportation (Non-Medical):   Physical Activity:   . Days of Exercise per Week:   . Minutes of Exercise per Session:   Stress:   . Feeling of Stress :   Social Connections:   . Frequency of Communication with Friends and Family:   . Frequency of Social Gatherings with Friends and Family:   . Attends Religious Services:   . Active Member of Clubs or Organizations:   . Attends Banker Meetings:   Marland Kitchen Marital Status:    Additional Social History:    Allergies:  No  Known Allergies  Labs:  Results for orders placed or performed during the hospital encounter of 08/09/19 (from the past 48 hour(s))  Urinalysis, Complete w Microscopic     Status: Abnormal   Collection Time: 08/09/19  5:42 PM  Result Value Ref Range   Color, Urine STRAW (A) YELLOW   APPearance CLEAR (A) CLEAR   Specific Gravity, Urine 1.006 1.005 - 1.030   pH 6.0 5.0 - 8.0   Glucose, UA NEGATIVE NEGATIVE mg/dL   Hgb urine dipstick NEGATIVE NEGATIVE   Bilirubin Urine NEGATIVE NEGATIVE   Ketones, ur NEGATIVE NEGATIVE mg/dL   Protein, ur NEGATIVE NEGATIVE mg/dL   Nitrite NEGATIVE NEGATIVE   Leukocytes,Ua NEGATIVE NEGATIVE   RBC / HPF 0-5 0 - 5 RBC/hpf   WBC, UA 0-5 0 - 5 WBC/hpf   Bacteria, UA NONE SEEN NONE SEEN   Squamous  Epithelial / LPF NONE SEEN 0 - 5    Comment: Performed at [facility], [address] [place], [place] [address]  Urine Drug Screen, Qualitative     Status: None   Collection Time: 08/09/19  5:42 PM  Result Value Ref Range   Tricyclic, Ur Screen NONE DETECTED NONE DETECTED   Amphetamines, Ur Screen NONE DETECTED NONE DETECTED   MDMA (Ecstasy)Ur Screen NONE DETECTED NONE DETECTED   Cocaine Metabolite,Ur [place] NONE DETECTED NONE DETECTED   Opiate, Ur Screen NONE DETECTED NONE DETECTED   Phencyclidine (PCP) Ur S NONE DETECTED NONE DETECTED   Cannabinoid 50 Ng, Ur [place] NONE DETECTED NONE DETECTED   Barbiturates, Ur Screen NONE DETECTED NONE DETECTED   Benzodiazepine, Ur Scrn NONE DETECTED NONE DETECTED   Methadone Scn, Ur NONE DETECTED NONE DETECTED    Comment: (NOTE) Tricyclics + metabolites, urine    Cutoff 1000 ng/mL Amphetamines + metabolites, urine  Cutoff 1000 ng/mL MDMA (Ecstasy), urine              Cutoff 500 ng/mL Cocaine Metabolite, urine          Cutoff 300 ng/mL Opiate + metabolites, urine        Cutoff 300 ng/mL Phencyclidine (PCP), urine         Cutoff 25 ng/mL Cannabinoid, urine                 Cutoff 50 ng/mL Barbiturates + metabolites,  urine  Cutoff 200 ng/mL Benzodiazepine, urine              Cutoff 200 ng/mL Methadone, urine                   Cutoff 300 ng/mL  The urine drug screen provides only a preliminary, unconfirmed analytical test result and should not be used for non-medical purposes. Clinical consideration and professional judgment should be applied to any positive drug screen result due to possible interfering substances. A more specific alternate chemical method must be used in order to obtain a confirmed analytical result. Gas chromatography / mass spectrometry (GC/MS) is the preferred confirm atory method. Performed at [facility], [address] [place], [place] [address]   CBC with Differential     Status: None   Collection Time: 08/09/19  6:34 PM  Result Value Ref Range   WBC 8.2 4.0 - 10.5 K/uL   RBC 5.03 4.22 - 5.81 MIL/uL   Hemoglobin 15.4 13.0 - 17.0 g/dL   HCT 71.2 39 - 52 %   MCV 87.3 80.0 - 100.0 fL   MCH 30.6 26.0 - 34.0 pg   MCHC 35.1 30.0 - 36.0 g/dL   RDW 45.8 09.9 - 83.3 %   Platelets 326 150 - 400 K/uL   nRBC 0.0 0.0 - 0.2 %   Neutrophils Relative % 62 %   Neutro Abs 5.1 1.7 - 7.7 K/uL   Lymphocytes Relative 29 %   Lymphs Abs 2.4 0.7 - 4.0 K/uL   Monocytes Relative 7 %   Monocytes Absolute 0.6 0 - 1 K/uL   Eosinophils Relative 1 %   Eosinophils Absolute 0.1 0 - 0 K/uL   Basophils Relative 1 %   Basophils Absolute 0.1 0 - 0 K/uL   Immature Granulocytes 0 %   Abs Immature Granulocytes 0.03 0.00 - 0.07 K/uL    Comment: Performed at [facility], [address] [place], [place] [address]  Basic metabolic panel     Status: Abnormal  Collection Time: 08/09/19  6:34 PM  Result Value Ref Range   Sodium 139 135 - 145 mmol/L   Potassium 3.9 3.5 - 5.1 mmol/L   Chloride 105 98 - 111 mmol/L   CO2 24 22 - 32 mmol/L   Glucose, Bld 101 (H) 70 - 99 mg/dL    Comment: Glucose reference range applies only to samples taken after fasting for at least 8 hours.   BUN  18 6 - 20 mg/dL   Creatinine, Ser 1.61 (H) 0.61 - 1.24 mg/dL   Calcium 9.6 8.9 - 09.6 mg/dL   GFR calc non Af Amer >60 >60 mL/min   GFR calc Af Amer >60 >60 mL/min   Anion gap 10 5 - 15    Comment: Performed at [facility], [address] [place], [place] [address]  Acetaminophen level     Status: Abnormal   Collection Time: 08/09/19  6:34 PM  Result Value Ref Range   Acetaminophen (Tylenol), Serum <10 (L) 10 - 30 ug/mL    Comment: (NOTE) Therapeutic concentrations vary significantly. A range of 10-30 ug/mL  may be an effective concentration for many patients. However, some  are best treated at concentrations outside of this range. Acetaminophen concentrations >150 ug/mL at 4 hours after ingestion  and >50 ug/mL at 12 hours after ingestion are often associated with  toxic reactions.  Performed at [facility], [address] [place], [place] [address]   Ethanol     Status: None   Collection Time: 08/09/19  6:34 PM  Result Value Ref Range   Alcohol, Ethyl (B) <10 <10 mg/dL    Comment: (NOTE) Lowest detectable limit for serum alcohol is 10 mg/dL.  For medical purposes only. Performed at [facility], [address] [place], [place] [address]   Salicylate level     Status: Abnormal   Collection Time: 08/09/19  6:34 PM  Result Value Ref Range   Salicylate Lvl <7.0 (L) 7.0 - 30.0 mg/dL    Comment: Performed at [facility], [address] [place] [address] [place], [place] [address]    Current Facility-Administered Medications  Medication Dose Route Frequency Provider Last Rate Last Admin  . traZODone (DESYREL) tablet 100 mg  100 mg Oral QHS PRN Loleta Rose, MD   100 mg at 08/09/19 2353   Current Outpatient Medications  Medication Sig Dispense Refill  . hydrOXYzine (ATARAX/VISTARIL) 25 MG tablet Take 1 tablet (25 mg total) by mouth at bedtime. 30 tablet 0  . pantoprazole (PROTONIX) 40 MG tablet Take 40 mg by mouth daily.    . sertraline (ZOLOFT)  100 MG tablet Take 200 mg by mouth at bedtime.       Musculoskeletal: Strength & Muscle Tone: within normal limits Gait & Station: normal Patient leans: N/A  Psychiatric Specialty Exam: Physical Exam Vitals and nursing note reviewed.  HENT:     Head: Normocephalic.     Nose: Nose normal.  Eyes:     Pupils: Pupils are equal, round, and reactive to light.  Cardiovascular:     Rate and Rhythm: Normal rate.  Pulmonary:     Effort: Pulmonary effort is normal.  Musculoskeletal:        General: Normal range of motion.     Cervical back: Normal range of motion.  Neurological:     General: No focal deficit present.     Mental Status: He is alert.  Psychiatric:        Attention and Perception: Attention and perception  normal.        Mood and Affect: Mood is anxious. Affect is labile.        Speech: Speech normal.        Behavior: Behavior is agitated. Behavior is cooperative.        Thought Content: Thought content does not include suicidal ideation. Thought content does not include suicidal plan.        Cognition and Memory: Cognition and memory normal.        Judgment: Judgment normal.     Review of Systems  Psychiatric/Behavioral: Positive for agitation. Negative for hallucinations and self-injury. The patient is nervous/anxious.   All other systems reviewed and are negative.   Blood pressure 129/87, pulse 88, temperature 98.4 F (36.9 C), temperature source Oral, resp. rate 16, height 6\' 2"  (1.88 m), weight 97.5 kg, SpO2 94 %.Body mass index is 27.6 kg/m.  General Appearance: Casual  Eye Contact:  Fair  Speech:  Clear and Coherent  Volume:  Normal  Mood:  Anxious and Depressed  Affect:  Congruent  Thought Process:  Coherent and Descriptions of Associations: Intact  Orientation:  Full (Time, Place, and Person)  Thought Content:  Illogical  Suicidal Thoughts:  No  Homicidal Thoughts:  No  Memory:  Immediate;   Fair  Judgement:  Impaired  Insight:  Lacking  Psychomotor  Activity:  Normal  Concentration:  Attention Span: Good  Recall:  Fair  Fund of Knowledge:  NA  Language:  Good  Akathisia:  NA  Handed:  Right  AIMS (if indicated):     Assets:  Communication Skills Desire for Improvement  ADL's:  Intact  Cognition:  WNL  Sleep:   reports good sleep     Treatment Plan Summary: Plan Reassess in the am  Disposition: reassess in the am for psychiatric evaluation   Jearld Leschashaun M Hopelynn Gartland, NP 08/10/2019 12:20 AM

## 2019-08-09 NOTE — ED Notes (Signed)
Pt. Was dressed out by this tech and was provided with hospital attire (burgundy pants and blue top and Brown socks)  Pt. Belongings were placed inside provided bag, Which includes :   [employer]ans [employer] Tan hat  [employer] Light blue mask  Wallet (inside back of pocket pants) Silver Ring  Black Socks  Underwear (gray) White T-shirt

## 2019-08-09 NOTE — ED Notes (Signed)
Report to include Situation, Background, Assessment, and Recommendations received from Kimberly RN. Patient alert and oriented, warm and dry, in no acute distress. Patient denies SI, HI, AVH and pain. Patient made aware of Q15 minute rounds and Rover and Officer presence for their safety. Patient instructed to come to me with needs or concerns.   

## 2019-08-09 NOTE — BH Assessment (Signed)
Assessment Note  Curtis [name] is an 44 y.o. male presenting to [facility] ED initially voluntary but has since been IVC'd by attending ER doctor. Per triage note Pt to ED by EMS with c/o of "over welling feeling that he needs help." Pt states he has SI thoughts but would not act on them and denies HI. Pt has hx of PTSD and states he has been hospitalized before for similar thoughts/feelings. During assessment patient was alert and oriented, irritable and had lack of insight. When asked why patient was presenting to the ED he reported "I just need help, I'm not able to process conversations with my wife, I go into defense mode." "I have chronic PTSD and Depression, I was going deeper into a hole, it's the beginning of one of my episodes." When asked what his episodes look like patient reports "I don't know, I just feel worthless, I feel like I'm out to get others, my life is up and down." Patient [facility] and [facility] his [facility] as [facility]. Patient denies current SI/HI/AH/VH but after looking at the IVC paperwork patient reported to the ER doctor that he had taken 20 prescription pills and was feeling suicidal yesterday, patient was then IVC'd, when asked why patient did not discuss that part he reported "I was just trying to get some good sleep, but I know now that that was reckless." Patient reports one past hospitalization in 2014 but was unable to report the reason, he reported "for the same thing."   Per Psyc NP patient will be observed overnight and reassessed in the morning  Diagnosis: Depression, PTSD by history   Past Medical History:  Past Medical History:  Diagnosis Date  . Anxiety   . Depression   . Gastroparesis   . GERD (gastroesophageal reflux disease)   . Kidney stone   . PTSD (post-traumatic stress disorder)     Past Surgical History:  Procedure Laterality Date  . ANKLE SURGERY    . APPENDECTOMY    . FRACTURE  SURGERY    . KIDNEY STONE SURGERY    . VASECTOMY      Family History:  Family History  Problem Relation Age of Onset  . Arthritis Mother   . Miscarriages / [place] Mother   . Arthritis Father   . Hearing loss Father   . Hypertension Father   . Arthritis Maternal Grandmother   . Depression Maternal Grandmother   . Diabetes Maternal Grandfather   . Hearing loss Maternal Grandfather   . Hypertension Maternal Grandfather   . Mental illness Maternal Grandfather   . Stroke Maternal Grandfather   . Early death Paternal Grandfather     Social History:  reports that he has been smoking cigarettes. He has been smoking about 0.50 packs per day. He has never used smokeless tobacco. He reports that he does not drink alcohol and does not use drugs.  Additional Social History:  Alcohol / Drug Use Pain Medications: See MAR Prescriptions: See MAR Over the Counter: See MAR History of alcohol / drug use?: No history of alcohol / drug abuse  CIWA: CIWA-Ar BP: 129/87 Pulse Rate: 88 COWS:    Allergies: No Known Allergies  Home Medications: (Not in a hospital admission)   OB/GYN Status:  No LMP for male patient.  General Assessment Data Location of Assessment: [facility] ED TTS Assessment: In system Is this a Tele or Face-to-Face Assessment?: Face-to-Face Is this an Initial Assessment  or a Re-assessment for this encounter?: Initial Assessment Language Other than English: No Living Arrangements: Other (Comment) What gender do you identify as?: Male Marital status: Married Pregnancy Status: No Living Arrangements: Spouse/significant other Can pt return to current living arrangement?: Yes Admission Status: Involuntary Petitioner: ED Attending Is patient capable of signing voluntary admission?: No Referral Source: Self/Family/Friend Insurance type: Medicare  Medical Screening Exam [facility] Walk-in ONLY) Medical Exam completed: Yes  Crisis Care Plan Living Arrangements:  Spouse/significant other Legal Guardian: Other: (Self) [facility] of Psychiatrist[facility] Name of Therapist: [facility]  Education Status Is patient currently in school?: No Is the patient employed, unemployed or receiving disability?: Receiving disability income  Risk to self with the past 6 months Suicidal Ideation: No-Not Currently/Within Last 6 Months Has patient been a risk to self within the past 6 months prior to admission? : Yes Suicidal Intent: No-Not Currently/Within Last 6 Months Has patient had any suicidal intent within the past 6 months prior to admission? : No Is patient at risk for suicide?: No Suicidal Plan?: No Has patient had any suicidal plan within the past 6 months prior to admission? : No Access to Means: No What has been your use of drugs/alcohol within the last 12 months?: None Previous Attempts/Gestures: Yes How many times?: 1 Other Self Harm Risks: None Triggers for Past Attempts: None known Intentional Self Injurious Behavior: None Family Suicide History: Unknown Recent stressful life event(s): Other (Comment), Conflict (Comment) (Marital issues, mood swings) Persecutory voices/beliefs?: No Depression: Yes Depression Symptoms: Feeling angry/irritable, Feeling worthless/self pity, Loss of interest in usual pleasures, Isolating Substance abuse history and/or treatment for substance abuse?: No Suicide prevention information given to non-admitted patients: Not applicable  Risk to Others within the past 6 months Homicidal Ideation: No Does patient have any lifetime risk of violence toward others beyond the six months prior to admission? : No Thoughts of Harm to Others: No Current Homicidal Intent: No Current Homicidal Plan: No Access to Homicidal Means: No Identified Victim: None History of harm to others?: No Assessment of Violence: None Noted Violent Behavior Description: None Does patient have access to weapons?: No Criminal Charges Pending?:  No Does patient have a court date: No Is patient on probation?: No  Psychosis Hallucinations: None noted Delusions: None noted  Mental Status Report Appearance/Hygiene: In scrubs Eye Contact: Good Motor Activity: Freedom of movement Speech: Logical/coherent Level of Consciousness: Alert Mood: Anxious, Irritable Affect: Appropriate to circumstance Anxiety Level: Moderate Thought Processes: Coherent Judgement: Unimpaired Orientation: Person, Place, Time, Situation, Appropriate for developmental age Obsessive Compulsive Thoughts/Behaviors: None  Cognitive Functioning Concentration: Normal Memory: Recent Intact, Remote Intact Is patient IDD: No Insight: Fair Impulse Control: Fair Appetite: Poor Have you had any weight changes? : No Change Sleep: Decreased Total Hours of Sleep: 5 Vegetative Symptoms: None  ADLScreening [facility] Assessment Services) Patient's cognitive ability adequate to safely complete daily activities?: Yes Patient able to express need for assistance with ADLs?: Yes Independently performs ADLs?: Yes (appropriate for developmental age)  Prior Inpatient Therapy Prior Inpatient Therapy: Yes Prior Therapy Dates: 2014 Prior Therapy Facilty/Provider(s): Unknown Reason for Treatment: "Same thing" mood swings  Prior Outpatient Therapy Prior Outpatient Therapy: Yes Prior Therapy Dates: Current Prior Therapy Facilty/Provider(s): [facility] Reason for Treatment: PTSD, Depression,  Does patient have an ACCT team?: No Does patient have Intensive In-House Services?  : No Does patient have [facility] services? : No Does patient have P4CC services?: No  ADL Screening (condition at time of admission) Patient's cognitive ability adequate  to safely complete daily activities?: Yes Is the patient deaf or have difficulty hearing?: No Does the patient have difficulty seeing, even when wearing glasses/contacts?: No Does the patient have difficulty concentrating,  remembering, or making decisions?: No Patient able to express need for assistance with ADLs?: Yes Does the patient have difficulty dressing or bathing?: No Independently performs ADLs?: Yes (appropriate for developmental age) Does the patient have difficulty walking or climbing stairs?: No Weakness of Legs: None Weakness of Arms/Hands: None  Home Assistive Devices/Equipment Home Assistive Devices/Equipment: None  Therapy Consults (therapy consults require a physician order) PT Evaluation Needed: No OT Evalulation Needed: No SLP Evaluation Needed: No Abuse/Neglect Assessment (Assessment to be complete while patient is alone) Abuse/Neglect Assessment Can Be Completed: Yes Physical Abuse: Denies Verbal Abuse: Denies Sexual Abuse: Denies Exploitation of patient/patient's resources: Denies Self-Neglect: Denies Values / Beliefs Cultural Requests During Hospitalization: None Spiritual Requests During Hospitalization: None Consults Spiritual Care Consult Needed: No Transition of Care Team Consult Needed: No            Disposition: Per Psyc NP patient will be observed overnight and reassessed in the morning Disposition Initial Assessment Completed for this Encounter: Yes  On Site Evaluation by:   Reviewed with Physician:    Benay Pike MS LCASA 08/09/2019 10:58 PM

## 2019-08-09 NOTE — ED Notes (Signed)
Hourly rounding reveals patient awake in room. No complaints, stable, in no acute distress. Q15 minute rounds and monitoring via Rover and Officer to continue.  

## 2019-08-09 NOTE — ED Triage Notes (Signed)
Pt to ED by EMS with c/o of "over welling feeling that he needs help." Pt states he has SI thoughts but would not act on them and denies HI. Pt has hx of PTSD and states he has been hospitalized before for similar thoughts/feelings.

## 2019-08-09 NOTE — ED Notes (Signed)
Patient asking for "something for sleep".

## 2019-08-09 NOTE — ED Notes (Signed)
Pt given tv remote to tv. Pt also given 240 mL of sprite

## 2019-08-10 DIAGNOSIS — F329 Major depressive disorder, single episode, unspecified: Secondary | ICD-10-CM | POA: Diagnosis not present

## 2019-08-10 LAB — SARS CORONAVIRUS 2 BY RT PCR (HOSPITAL ORDER, PERFORMED IN ~~LOC~~ HOSPITAL LAB): SARS Coronavirus 2: NEGATIVE

## 2019-08-10 MED ORDER — LORAZEPAM 1 MG PO TABS
1.0000 mg | ORAL_TABLET | Freq: Once | ORAL | Status: AC
  Administered 2019-08-10: 1 mg via ORAL
  Filled 2019-08-10: qty 1

## 2019-08-10 MED ORDER — ACETAMINOPHEN 325 MG PO TABS
650.0000 mg | ORAL_TABLET | Freq: Four times a day (QID) | ORAL | Status: DC | PRN
  Administered 2019-08-10 – 2019-08-11 (×2): 650 mg via ORAL
  Filled 2019-08-10 (×2): qty 2

## 2019-08-10 NOTE — ED Notes (Signed)
Hourly rounding reveals patient sleeping in room. No complaints, stable, in no acute distress. Q15 minute rounds and monitoring via Rover and Officer to continue.  

## 2019-08-10 NOTE — BH Assessment (Signed)
Referral information for Psychiatric Hospitalization faxed to;   . [facility] ([phone]-or- [phone]),   . [facility] ([phone]---[phone]---[phone]),  . [facility] ([phone]),   . [facility] ([phone] -or- [phone]),    

## 2019-08-10 NOTE — BH Assessment (Signed)
Writer spoke with the patient to complete an updated/reassessment. Patient denies SI/HI and AV/H. Pt was oriented x4 and presented with coherent/logical speech. Pt reported worsening PTSD symptoms that are impacting his personal relationships. Per psych MD, patient meets inpatient criteria. Pt has been referred to the [place].

## 2019-08-10 NOTE — ED Notes (Signed)
Hourly rounding reveals patient asleep in room. No complaints, stable, in no acute distress. Q15 minute rounds and monitoring via Rover and Officer to continue.  

## 2019-08-10 NOTE — ED Notes (Signed)
PSYCH AND TTS  AT BEDSIDE TO CONSULT PATIENT. PLAN OF CARE PENDING.

## 2019-08-10 NOTE — ED Notes (Signed)
Sprite and water was given

## 2019-08-10 NOTE — ED Notes (Signed)
Assumed care of patient. Patient sleeping comfortably, as per shift nurse patient slept through the night w/o issues or concerns. Will continue to monitor. Safety maintained.  

## 2019-08-10 NOTE — Final Progress Note (Signed)
Physician Final Progress Note  Patient ID: Curtis [name] MRN: [id number] DOB/AGE: 1975/06/19 44 y.o.  Admit date: 08/09/2019 Admitting provider: No admitting provider for patient encounter. Discharge date: 08/10/2019   Pending Psych bed transfer    Admission Diagnoses:  IED marital discord  Major depression severe recurrent PTSD  Personality disorder nos     Discharge Diagnoses:   Same --awaits bed transfer to [facility] or similar    Principal Problem:   Anxiety Active Problems:   Depression   Consults:  Psych MD / TTS / ER MD   Significant Findings/ Diagnostic Studies:  None   Procedures:  None  Discharge Condition:   Still with  Unresolved issues and symptoms awaits bed transfer    [facility]  [facility]   Diet:  As tolerated   Discharge Activity: { will be supportive groups RTC ---or day treatment after inpatient   Needs couples therapy and med mgt as well    MS exam Anxious distraught vague  Poor insight judgement fair reliablity fair Mood and affect depressed andanxious  Has multiple PTSD and anxiety issues Irritable edgy frustrated Unclear safety margin ----wanted to harm Self took Atarax tabs to sleep but still with possible active thoughts to harm self  No frank psychosis or mania   Needs admit due to clinical deterioration risk as well as ----risk of safety      Total time spent taking care of this patient: one hour  Signed: Roselind Messier 08/10/2019, 4:40 PM

## 2019-08-10 NOTE — ED Provider Notes (Signed)
Emergency Medicine Observation Re-evaluation Note  Curtis [name] is a 44 y.o. male, seen on rounds today.  Pt initially presented to the ED for complaints of Voluntary and Mental Evaluation Currently, the patient is IVC  Physical Exam  BP 129/87 (BP Location: Left Arm)   Pulse 88   Temp 98.4 F (36.9 C) (Oral)   Resp 16   Ht 6\' 2"  (1.88 m)   Wt 97.5 kg   SpO2 94%   BMI 27.60 kg/m  Physical Exam Patient is sleeping comfortably in bed. Constitutional: Comfortable in no distress Respiratory: Patient breathing easily no retractions ED Course / MDM  EKG:    I have reviewed the labs performed to date as well as medications administered while in observation.   Plan  Patient is IVC.  Disposition will be by psychiatry.   , MD 08/10/19 [phone]

## 2019-08-10 NOTE — ED Notes (Signed)
Hourly rounding reveals patient awake in room. No complaints, stable, in no acute distress. Q15 minute rounds and monitoring via Rover and Officer to continue.  

## 2019-08-10 NOTE — ED Notes (Signed)
[facility] is on a Psych diversion and have no beds available.

## 2019-08-11 DIAGNOSIS — F329 Major depressive disorder, single episode, unspecified: Secondary | ICD-10-CM | POA: Diagnosis not present

## 2019-08-11 MED ORDER — HYDROXYZINE HCL 25 MG PO TABS
50.0000 mg | ORAL_TABLET | Freq: Four times a day (QID) | ORAL | Status: DC | PRN
  Administered 2019-08-11: 50 mg via ORAL
  Filled 2019-08-11: qty 2

## 2019-08-11 NOTE — ED Provider Notes (Signed)
Emergency Medicine Observation Re-evaluation Note  Curtis [name] is a 44 y.o. male, seen on rounds today.  Pt initially presented to the ED for complaints of Voluntary and Mental Evaluation   Physical Exam  BP 131/78 (BP Location: Left Arm)   Pulse 65   Temp 97.7 F (36.5 C) (Oral)   Resp 16   Ht 6\' 2"  (1.88 m)   Wt 97.5 kg   SpO2 100%   BMI 27.60 kg/m  Physical Exam Constitutional patient sleeping comfortably in no distress Respiratory patient breathing easily no retractions he is snoring very slightly. ED Course / MDM  EKG:    I have reviewed the labs performed to date as well as medications administered while in observation. Plan  Patient awaiting transfer to the [facility] or other inpatient treatment.  I filled out the [facility] transfer papers yesterday myself.   [place], MD 08/11/19 [phone]

## 2019-08-11 NOTE — ED Notes (Signed)
Pt c/o headache. Tylenol given.

## 2019-08-11 NOTE — ED Notes (Signed)
Pt given meal tray.

## 2019-08-11 NOTE — BH Assessment (Signed)
PATIENT BED AVAILABLE AFTER 11AM ON 08/11/19  Patient has been accepted to [facility].  Accepting physician is Dr. Elmer Picker.  Call report to [phone].  Representative was [facility].   ER Staff is aware of it:  Melody ER Secretary  Dr. Don Perking, ER MD  Dewayne Hatch Patient's Nurse       Address: [address], [place] [address]

## 2019-08-11 NOTE — ED Notes (Signed)
Pt given meal tray with sprite.

## 2019-08-11 NOTE — ED Notes (Signed)
Pt calm and cooperative. Eager to eat breakfast. Able to hold normal conversation. Denies SI at this time.

## 2019-08-11 NOTE — ED Notes (Signed)
Pending transport to [facility] after 11AM 7/11.

## 2019-10-17 IMAGING — DX DG CHEST 2V
2 series · 2 of 2 positions shown · non-contrast
Comparison: None.

CLINICAL DATA: Chest pain

EXAM:
CHEST - 2 VIEW

[chest pa]
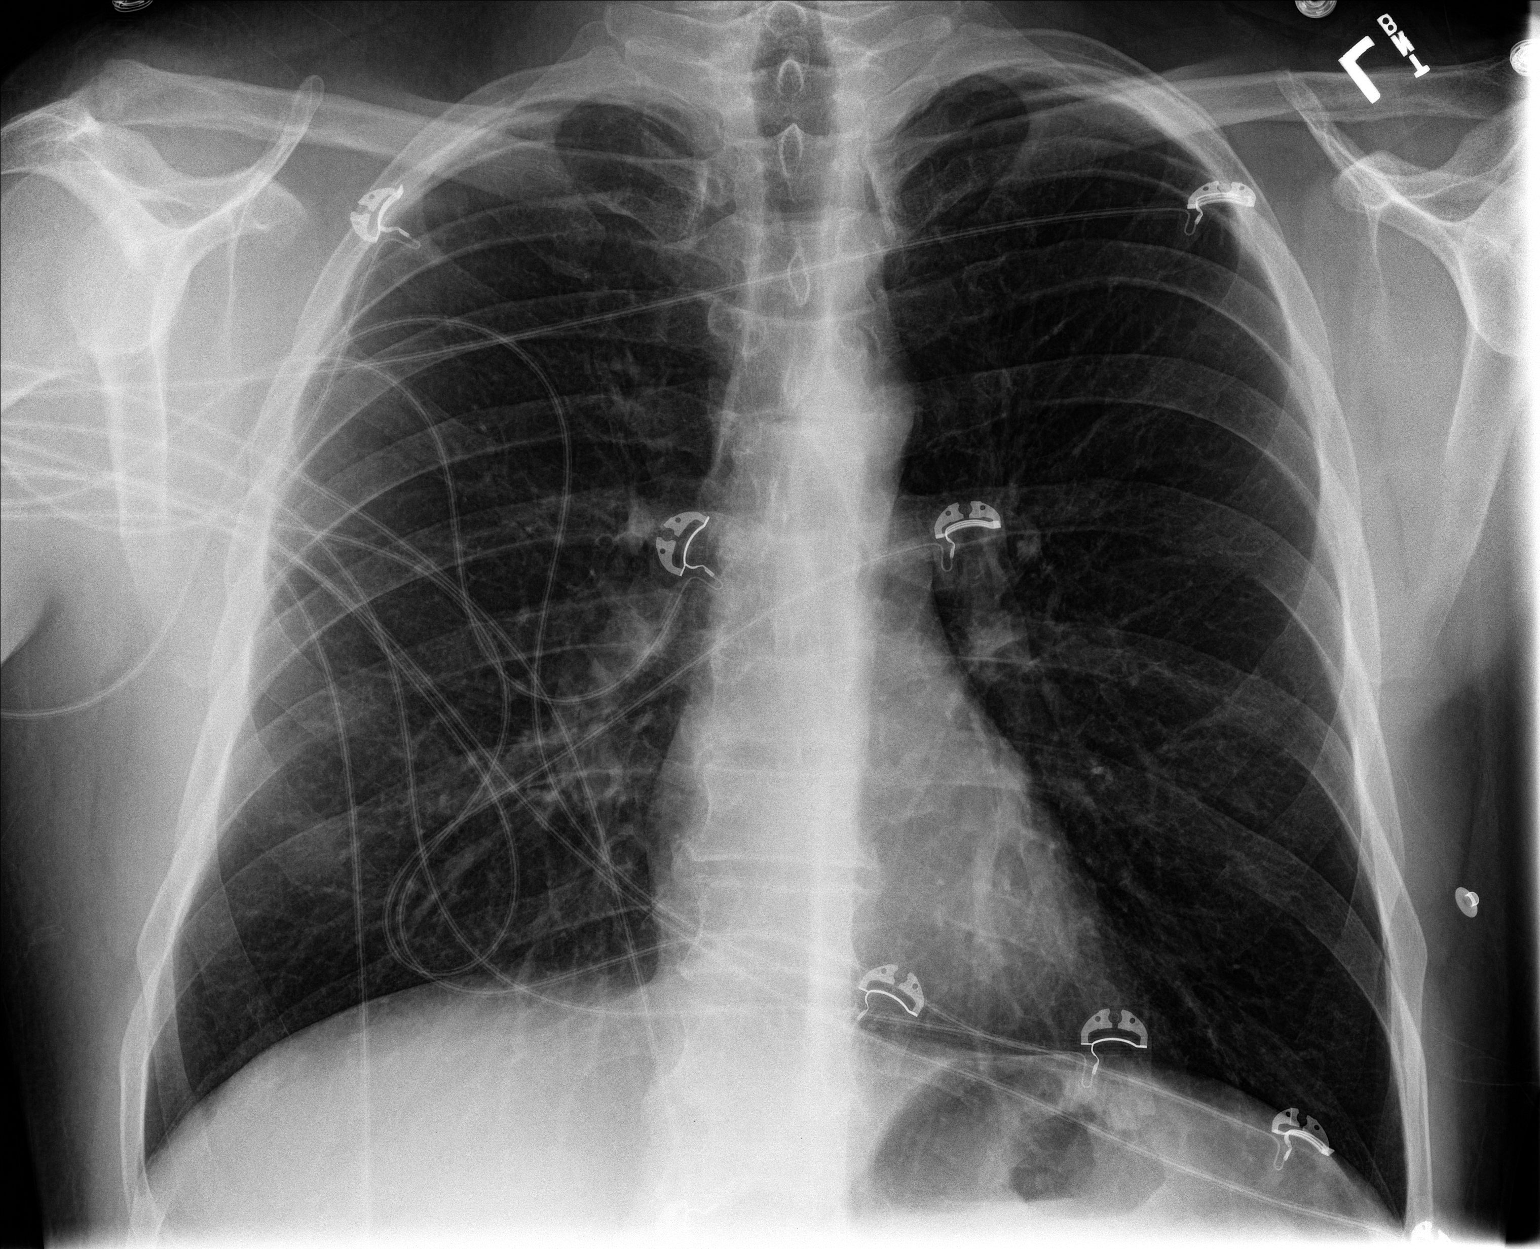

[chest lat]
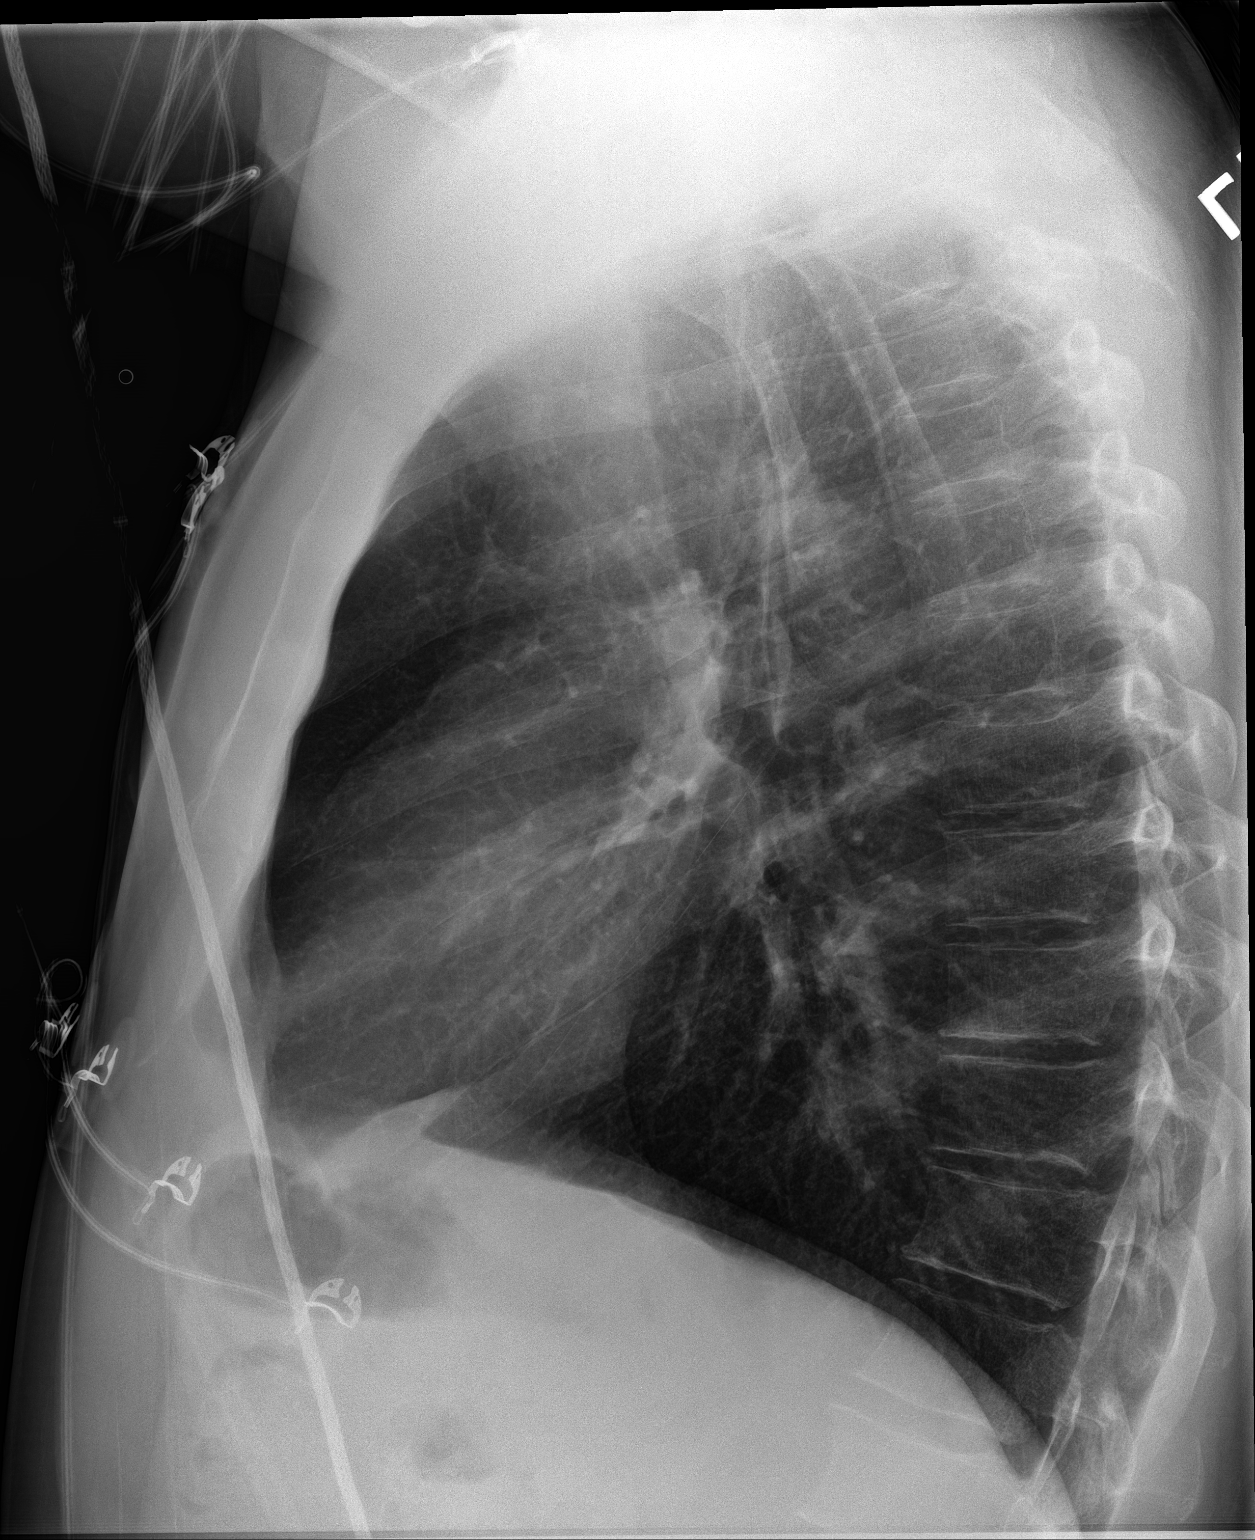

[2 of 2 positions shown; findings below may reference images not displayed]

FINDINGS: Image quality degraded by over penetration

Pulmonary hyperinflation. Lungs are clear without infiltrate
effusion or mass. Heart size and vascularity normal.
IMPRESSION: Pulmonary hyperinflation.  No acute abnormality.
